# Patient Record
Sex: Female | Born: 1965 | Race: White | Hispanic: No | Marital: Single | Smoking: Former smoker
Health system: Southern US, Community
[De-identification: ages and names within clinical notes are randomized; demographics above are authoritative.]

## PROBLEM LIST (undated history)

## (undated) DIAGNOSIS — K635 Polyp of colon: Secondary | ICD-10-CM

## (undated) DIAGNOSIS — K8020 Calculus of gallbladder without cholecystitis without obstruction: Secondary | ICD-10-CM

## (undated) DIAGNOSIS — C801 Malignant (primary) neoplasm, unspecified: Secondary | ICD-10-CM

## (undated) DIAGNOSIS — K5790 Diverticulosis of intestine, part unspecified, without perforation or abscess without bleeding: Secondary | ICD-10-CM

## (undated) DIAGNOSIS — T7840XA Allergy, unspecified, initial encounter: Secondary | ICD-10-CM

## (undated) DIAGNOSIS — K648 Other hemorrhoids: Secondary | ICD-10-CM

## (undated) DIAGNOSIS — D126 Benign neoplasm of colon, unspecified: Secondary | ICD-10-CM

## (undated) DIAGNOSIS — K2970 Gastritis, unspecified, without bleeding: Secondary | ICD-10-CM

## (undated) DIAGNOSIS — D649 Anemia, unspecified: Secondary | ICD-10-CM

## (undated) DIAGNOSIS — D49519 Neoplasm of unspecified behavior of unspecified kidney: Secondary | ICD-10-CM

## (undated) DIAGNOSIS — K760 Fatty (change of) liver, not elsewhere classified: Secondary | ICD-10-CM

## (undated) HISTORY — DX: Anemia, unspecified: D64.9

## (undated) HISTORY — DX: Other hemorrhoids: K64.8

## (undated) HISTORY — DX: Polyp of colon: K63.5

## (undated) HISTORY — DX: Benign neoplasm of colon, unspecified: D12.6

## (undated) HISTORY — PX: PARTIAL HYSTERECTOMY: SHX80

## (undated) HISTORY — PX: CHOLECYSTECTOMY: SHX55

## (undated) HISTORY — PX: TONSILLECTOMY: SUR1361

## (undated) HISTORY — DX: Diverticulosis of intestine, part unspecified, without perforation or abscess without bleeding: K57.90

## (undated) HISTORY — DX: Gastritis, unspecified, without bleeding: K29.70

## (undated) HISTORY — PX: ABDOMINAL HYSTERECTOMY: SHX81

## (undated) HISTORY — DX: Fatty (change of) liver, not elsewhere classified: K76.0

## (undated) HISTORY — DX: Neoplasm of unspecified behavior of unspecified kidney: D49.519

## (undated) HISTORY — DX: Allergy, unspecified, initial encounter: T78.40XA

## (undated) HISTORY — DX: Calculus of gallbladder without cholecystitis without obstruction: K80.20

---
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
## 1988-01-30 HISTORY — PX: FOOT SURGERY: SHX648

## 2013-12-04 ENCOUNTER — Encounter: Payer: Self-pay | Admitting: Podiatry

## 2013-12-04 ENCOUNTER — Ambulatory Visit (INDEPENDENT_AMBULATORY_CARE_PROVIDER_SITE_OTHER): Payer: 59 | Admitting: Podiatry

## 2013-12-04 ENCOUNTER — Ambulatory Visit (INDEPENDENT_AMBULATORY_CARE_PROVIDER_SITE_OTHER): Payer: 59

## 2013-12-04 VITALS — BP 133/82 | HR 64 | Resp 13

## 2013-12-04 DIAGNOSIS — M722 Plantar fascial fibromatosis: Secondary | ICD-10-CM

## 2013-12-04 MED ORDER — TRIAMCINOLONE ACETONIDE 10 MG/ML IJ SUSP
10.0000 mg | Freq: Once | INTRAMUSCULAR | Status: AC
  Administered 2013-12-04: 10 mg

## 2013-12-04 NOTE — Patient Instructions (Signed)
Plantar Fasciitis (Heel Spur Syndrome) with Rehab The plantar fascia is a fibrous, ligament-like, soft-tissue structure that spans the bottom of the foot. Plantar fasciitis is a condition that causes pain in the foot due to inflammation of the tissue. SYMPTOMS   Pain and tenderness on the underneath side of the foot.  Pain that worsens with standing or walking. CAUSES  Plantar fasciitis is caused by irritation and injury to the plantar fascia on the underneath side of the foot. Common mechanisms of injury include:  Direct trauma to bottom of the foot.  Damage to a small nerve that runs under the foot where the main fascia attaches to the heel bone.  Stress placed on the plantar fascia due to bone spurs. RISK INCREASES WITH:   Activities that place stress on the plantar fascia (running, jumping, pivoting, or cutting).  Poor strength and flexibility.  Improperly fitted shoes.  Tight calf muscles.  Flat feet.  Failure to warm-up properly before activity.  Obesity. PREVENTION  Warm up and stretch properly before activity.  Allow for adequate recovery between workouts.  Maintain physical fitness:  Strength, flexibility, and endurance.  Cardiovascular fitness.  Maintain a health body weight.  Avoid stress on the plantar fascia.  Wear properly fitted shoes, including arch supports for individuals who have flat feet. PROGNOSIS  If treated properly, then the symptoms of plantar fasciitis usually resolve without surgery. However, occasionally surgery is necessary. RELATED COMPLICATIONS   Recurrent symptoms that may result in a chronic condition.  Problems of the lower back that are caused by compensating for the injury, such as limping.  Pain or weakness of the foot during push-off following surgery.  Chronic inflammation, scarring, and partial or complete fascia tear, occurring more often from repeated injections. TREATMENT  Treatment initially involves the use of  ice and medication to help reduce pain and inflammation. The use of strengthening and stretching exercises may help reduce pain with activity, especially stretches of the Achilles tendon. These exercises may be performed at home or with a therapist. Your caregiver may recommend that you use heel cups of arch supports to help reduce stress on the plantar fascia. Occasionally, corticosteroid injections are given to reduce inflammation. If symptoms persist for greater than 6 months despite non-surgical (conservative), then surgery may be recommended.  MEDICATION   If pain medication is necessary, then nonsteroidal anti-inflammatory medications, such as aspirin and ibuprofen, or other minor pain relievers, such as acetaminophen, are often recommended.  Do not take pain medication within 7 days before surgery.  Prescription pain relievers may be given if deemed necessary by your caregiver. Use only as directed and only as much as you need.  Corticosteroid injections may be given by your caregiver. These injections should be reserved for the most serious cases, because they may only be given a certain number of times. HEAT AND COLD  Cold treatment (icing) relieves pain and reduces inflammation. Cold treatment should be applied for 10 to 15 minutes every 2 to 3 hours for inflammation and pain and immediately after any activity that aggravates your symptoms. Use ice packs or massage the area with a piece of ice (ice massage).  Heat treatment may be used prior to performing the stretching and strengthening activities prescribed by your caregiver, physical therapist, or athletic trainer. Use a heat pack or soak the injury in warm water. SEEK IMMEDIATE MEDICAL CARE IF:  Treatment seems to offer no benefit, or the condition worsens.  Any medications produce adverse side effects. EXERCISES RANGE   OF MOTION (ROM) AND STRETCHING EXERCISES - Plantar Fasciitis (Heel Spur Syndrome) These exercises may help you  when beginning to rehabilitate your injury. Your symptoms may resolve with or without further involvement from your physician, physical therapist or athletic trainer. While completing these exercises, remember:   Restoring tissue flexibility helps normal motion to return to the joints. This allows healthier, less painful movement and activity.  An effective stretch should be held for at least 30 seconds.  A stretch should never be painful. You should only feel a gentle lengthening or release in the stretched tissue. RANGE OF MOTION - Toe Extension, Flexion  Sit with your right / left leg crossed over your opposite knee.  Grasp your toes and gently pull them back toward the top of your foot. You should feel a stretch on the bottom of your toes and/or foot.  Hold this stretch for __________ seconds.  Now, gently pull your toes toward the bottom of your foot. You should feel a stretch on the top of your toes and or foot.  Hold this stretch for __________ seconds. Repeat __________ times. Complete this stretch __________ times per day.  RANGE OF MOTION - Ankle Dorsiflexion, Active Assisted  Remove shoes and sit on a chair that is preferably not on a carpeted surface.  Place right / left foot under knee. Extend your opposite leg for support.  Keeping your heel down, slide your right / left foot back toward the chair until you feel a stretch at your ankle or calf. If you do not feel a stretch, slide your bottom forward to the edge of the chair, while still keeping your heel down.  Hold this stretch for __________ seconds. Repeat __________ times. Complete this stretch __________ times per day.  STRETCH - Gastroc, Standing  Place hands on wall.  Extend right / left leg, keeping the front knee somewhat bent.  Slightly point your toes inward on your back foot.  Keeping your right / left heel on the floor and your knee straight, shift your weight toward the wall, not allowing your back to  arch.  You should feel a gentle stretch in the right / left calf. Hold this position for __________ seconds. Repeat __________ times. Complete this stretch __________ times per day. STRETCH - Soleus, Standing  Place hands on wall.  Extend right / left leg, keeping the other knee somewhat bent.  Slightly point your toes inward on your back foot.  Keep your right / left heel on the floor, bend your back knee, and slightly shift your weight over the back leg so that you feel a gentle stretch deep in your back calf.  Hold this position for __________ seconds. Repeat __________ times. Complete this stretch __________ times per day. STRETCH - Gastrocsoleus, Standing  Note: This exercise can place a lot of stress on your foot and ankle. Please complete this exercise only if specifically instructed by your caregiver.   Place the ball of your right / left foot on a step, keeping your other foot firmly on the same step.  Hold on to the wall or a rail for balance.  Slowly lift your other foot, allowing your body weight to press your heel down over the edge of the step.  You should feel a stretch in your right / left calf.  Hold this position for __________ seconds.  Repeat this exercise with a slight bend in your right / left knee. Repeat __________ times. Complete this stretch __________ times per day.    STRENGTHENING EXERCISES - Plantar Fasciitis (Heel Spur Syndrome)  These exercises may help you when beginning to rehabilitate your injury. They may resolve your symptoms with or without further involvement from your physician, physical therapist or athletic trainer. While completing these exercises, remember:   Muscles can gain both the endurance and the strength needed for everyday activities through controlled exercises.  Complete these exercises as instructed by your physician, physical therapist or athletic trainer. Progress the resistance and repetitions only as guided. STRENGTH -  Towel Curls  Sit in a chair positioned on a non-carpeted surface.  Place your foot on a towel, keeping your heel on the floor.  Pull the towel toward your heel by only curling your toes. Keep your heel on the floor.  If instructed by your physician, physical therapist or athletic trainer, add ____________________ at the end of the towel. Repeat __________ times. Complete this exercise __________ times per day. STRENGTH - Ankle Inversion  Secure one end of a rubber exercise band/tubing to a fixed object (table, pole). Loop the other end around your foot just before your toes.  Place your fists between your knees. This will focus your strengthening at your ankle.  Slowly, pull your big toe up and in, making sure the band/tubing is positioned to resist the entire motion.  Hold this position for __________ seconds.  Have your muscles resist the band/tubing as it slowly pulls your foot back to the starting position. Repeat __________ times. Complete this exercises __________ times per day.  Document Released: 01/15/2005 Document Revised: 04/09/2011 Document Reviewed: 04/29/2008 [employer] Patient Information 2015 [employer]. This information is not intended to replace advice given to you by your health care provider. Make sure you discuss any questions you have with your health care provider.  

## 2013-12-04 NOTE — Progress Notes (Signed)
   Subjective:    Patient ID: Tina Adams, female    DOB: [birth year], 48 y.o.   MRN: [id number]  HPI Tina Adams, 48 year old female, returns the office today for complaints of right heel pain for which she believes his plantar fasciitis. She is previously been treated for plantar fasciitis and left foot. She has tried stretching exercises as well as icing and shoe gear modifications without much resolution in symptoms. She denies any recent injury or trauma to the area. She also states that she has bilateral lower extremity numbness starting from approximately mid leg to her feet. She is previously seen a neurologist for this and they have been unable to identify the etiology. She denies any acute changes in the symptoms. No other complaints at this time.   Review of Systems  HENT: Positive for congestion, ear pain and sinus pressure.   All other systems reviewed and are negative.      Objective:   Physical Exam AAO 3, NAD DP/PT pulses palpable bilaterally, CRT less than 3 seconds Protective sensation decreased with Simms Weinstein monofilament, decreased vibratory sensation. Tenderness palpation over the plantar medial tubercle of the calcaneus on the right foot at the insertion of the plantar fascia. There is no pain along the course of the plantar fascia within the arch of the foot. There is no pain with lateral compression of the calcaneus or with vibratory sensation. No pain on the posterior aspect or along the course of the Achilles tendon. There is no edema, erythema, increased warmth over the area. Overall cavus foot type with hammertoe contractures. MMT 5/5, ROM WNL No open lesions No calf pain, swelling, warmth, erythema       Assessment & Plan:  48 year old female with cavus foot type and right heel pain, likely plantar fasciitis; neuropathy. -X-rays were obtained and reviewed with the patient. -Conservative versus surgical treatment discussed including alternatives,  risks, complications. -Patient elects to proceed with steroid injection into the right heel. Under sterile skin preparation, a total of 2.5cc of kenalog 10, 0.5% Marcaine plain, and 2% lidocaine plain were infiltrated into the symptomatic area without complication. A band-aid was applied. Patient tolerated the injection well without complication.  -Plantar fascial brace dispensed. -Discussed stretching exercises -Ice to the affected area. -Discussed proper shoe gear and possible inserts or orthotics to help support her foot type. -Follow-up in 3 weeks or sooner if any problems are to arise. In the meantime call the office with any questions, concerns, changes symptoms. Patient states that she previously saw neurology for the neuropathy and she was given B-12 injections but. Going as they were not helping. I discussed with her that if she would like further workup for this to be reevaluated by neurology.

## 2013-12-09 DIAGNOSIS — M722 Plantar fascial fibromatosis: Secondary | ICD-10-CM | POA: Insufficient documentation

## 2015-01-30 HISTORY — PX: BLADDER REPAIR: SHX76

## 2015-04-13 ENCOUNTER — Encounter: Payer: Self-pay | Admitting: Podiatry

## 2015-04-13 ENCOUNTER — Ambulatory Visit (INDEPENDENT_AMBULATORY_CARE_PROVIDER_SITE_OTHER): Payer: 59 | Admitting: Podiatry

## 2015-04-13 ENCOUNTER — Ambulatory Visit (HOSPITAL_BASED_OUTPATIENT_CLINIC_OR_DEPARTMENT_OTHER)
Admission: RE | Admit: 2015-04-13 | Discharge: 2015-04-13 | Disposition: A | Discharge status: Home / Self Care | Payer: 59 | Source: Ambulatory Visit | Attending: Podiatry | Admitting: Podiatry

## 2015-04-13 VITALS — BP 174/98 | HR 72 | Resp 18

## 2015-04-13 DIAGNOSIS — M79671 Pain in right foot: Secondary | ICD-10-CM | POA: Insufficient documentation

## 2015-04-13 DIAGNOSIS — R52 Pain, unspecified: Secondary | ICD-10-CM

## 2015-04-13 DIAGNOSIS — L84 Corns and callosities: Secondary | ICD-10-CM

## 2015-04-13 DIAGNOSIS — G5761 Lesion of plantar nerve, right lower limb: Secondary | ICD-10-CM | POA: Diagnosis not present

## 2015-04-13 DIAGNOSIS — D3610 Benign neoplasm of peripheral nerves and autonomic nervous system, unspecified: Secondary | ICD-10-CM

## 2015-04-14 NOTE — Progress Notes (Signed)
Patient ID: Tina Adams, female   DOB: 18-Nov-1965, 50 y.o.   MRN: [id number]  Subjective: 50 year old female presents the office today for concerns of right foot pain which is been ongoing the last couple months that seems worsening of the last week. She states that she gets a sharp pain between the third and fourth toes which radius of the toes and at the foot at times. She has a states that she's got a callus on the bottom of her foot which is very painful. No recent injury or trauma. No swelling or redness. She states that the bandage is noted for the neuropathy has decreased since her last visit with me as she is change her diet and seems to help. Denies any systemic complaints such as fevers, chills, nausea, vomiting. No acute changes since last appointment, and no other complaints at this time.   Objective: AAO x3, NAD DP/PT pulses palpable bilaterally, CRT less than 3 seconds Protective sensation decreased with Simms Weinstein monofilament There is tenderness palpation to the right third interspace. There is no palpable neuroma identified at this time there is no clicking sensation. There is also hyperkeratotic lesion submetatarsal 4 with tenderness to palpation along this area. Upon debridement no underlying ulceration, drainage or other signs of infection. There is a cavus foot type present bilaterally. No areas of pinpoint bony tenderness or pain with vibratory sensation. MMT 5/5, ROM WNL. No edema, erythema, increase in warmth to bilateral lower extremities.  No open lesions or pre-ulcerative lesions.  No pain with calf compression, swelling, warmth, erythema  Assessment: Right third interspace pain neuroma versus bursa; hyperkeratotic lesion  Plan: -All treatment options discussed with the patient including all alternatives, risks, complications.  -X-rays were ordered and reviewed. -Hyperkeratotic lesion was debrided without complications or bleeding -Discussed steroid injection  into the interspace and she wishes to proceed with this. Under sterile conditions a mixture of Kenalog and local anesthetic was infiltrated into the area of maximal tenderness without complications. Post injection care was discussed. -Metatarsal off on an pads were dispensed. -Patient encouraged to call the office with any questions, concerns, change in symptoms.   Celesta Gentile, DPM

## 2015-05-11 ENCOUNTER — Ambulatory Visit: Payer: 59 | Admitting: Podiatry

## 2017-03-28 ENCOUNTER — Encounter: Payer: Self-pay | Admitting: Internal Medicine

## 2017-05-21 ENCOUNTER — Encounter: Payer: Self-pay | Admitting: *Deleted

## 2017-06-18 ENCOUNTER — Ambulatory Visit: Payer: 59 | Admitting: Internal Medicine

## 2017-06-18 ENCOUNTER — Encounter: Payer: Self-pay | Admitting: Internal Medicine

## 2017-06-18 VITALS — BP 122/86 | HR 68 | Ht 69.0 in | Wt 224.6 lb

## 2017-06-18 DIAGNOSIS — R1013 Epigastric pain: Secondary | ICD-10-CM | POA: Diagnosis not present

## 2017-06-18 DIAGNOSIS — Z86010 Personal history of colonic polyps: Secondary | ICD-10-CM

## 2017-06-18 MED ORDER — RANITIDINE HCL 150 MG PO TABS: 150.0000 mg | ORAL_TABLET | Freq: Every day | ORAL | 0 refills | Status: DC | PRN

## 2017-06-18 MED ORDER — SUPREP BOWEL PREP KIT 17.5-3.13-1.6 GM/177ML PO SOLN: 1.0000 | ORAL | 0 refills | Status: DC

## 2017-06-18 NOTE — Patient Instructions (Signed)
You have been scheduled for a colonoscopy. Please follow written instructions given to you at your visit today.  Please pick up your prep supplies at the pharmacy within the next 1-3 days. If you use inhalers (even only as needed), please bring them with you on the day of your procedure. Your physician has requested that you go to www.[email or web address].[email or web address] and enter the access code given to you at your visit today. This web site gives a general overview about your procedure. However, you should still follow specific instructions given to you by our office regarding your preparation for the procedure.  Please purchase the following medications over the counter and take as directed: Zantac 150 mg once every evening as needed.  If you are age 45 or older, your body mass index should be between 23-30. Your Body mass index is 33.17 kg/m. If this is out of the aforementioned range listed, please consider follow up with your Primary Care Provider.  If you are age 66 or younger, your body mass index should be between 19-25. Your Body mass index is 33.17 kg/m. If this is out of the aformentioned range listed, please consider follow up with your Primary Care Provider.

## 2017-06-19 NOTE — Progress Notes (Signed)
Patient ID: Tina Adams, female   DOB: 1965/12/12, 52 y.o.   MRN: [id number] HPI: Tina Adams is a 52 year old female with a past medical history of colon polyps, gallstones status post cholecystectomy who was seen in consultation at the request of Dr. Arnette Norris to consider surveillance colonoscopy.  She is here alone today.  She reports that she has been feeling well.  Occasionally she does notice belching and upper abdominal bloating with certain foods and also with eating quickly.  Foods such as iceberg lettuce as well as red meats lead to the symptoms.  Symptoms are fairly rapidly relieved by Tums or Rolaids.  Happens a few times per week.  No dysphagia or odynophagia.  No true heartburn.  Worse after the evening meal.  Was on remote PPI in the past for heartburn and dyspeptic symptoms that have now mostly been absent.  She felt she needed these when she was under more stress and overall feels to be in a better place currently.  She has had colonoscopies in the past in 2013 her last colonoscopy was performed.  She had an adenomatous polyp and a sessile serrated polyp removed.  In 2008 she had a hyperplastic polyp removed.  Her mother has a history of colon polyps as well.  No recent change in bowel habits, no report of troublesome constipation or diarrhea.  No blood in her stool or melena.  Past Medical History:  Diagnosis Date  . Anemia   . Diverticulosis   . Gallstones   . Gastritis   . Hyperplastic colon polyp   . Tubular adenoma of colon     Past Surgical History:  Procedure Laterality Date  . CHOLECYSTECTOMY    . FOOT SURGERY  1990   plantar fasciitis  . PARTIAL HYSTERECTOMY    . TONSILLECTOMY      Outpatient Medications Prior to Visit  Medication Sig Dispense Refill  . ALPRAZolam (XANAX) 0.5 MG tablet Take 0.5 mg by mouth at bedtime as needed.     . fexofenadine-pseudoephedrine (ALLEGRA-D 24) 180-240 MG 24 hr tablet Take 1 tablet by mouth daily.    Marland Kitchen ALPRAZolam (XANAX PO)  Take by mouth at bedtime as needed.    . loratadine (CLARITIN) 10 MG tablet Take 10 mg by mouth.     No facility-administered medications prior to visit.     Allergies  Allergen Reactions  . Codeine Nausea And Vomiting  . Penicillins Hives  . Sulfa Antibiotics     Family History  Problem Relation Age of Onset  . COPD Mother   . Heart disease Mother   . Colon polyps Mother        in her 30s  . Diabetes Father   . Heart disease Father   . Hyperlipidemia Father   . Heart disease Maternal Grandmother   . Colon cancer Neg Hx   . Esophageal cancer Neg Hx   . Stomach cancer Neg Hx   . Pancreatic cancer Neg Hx     Social History   Tobacco Use  . Smoking status: Light Tobacco Smoker  . Smokeless tobacco: Never Used  Substance Use Topics  . Alcohol use: Yes    Alcohol/week: 2.4 oz    Types: 4 Glasses of wine per week  . Drug use: Never    ROS: As per history of present illness, otherwise negative  BP 122/86   Pulse 68   Ht 5\' 9"  (1.753 m)   Wt 224 lb 9.6 oz (101.9 kg)   BMI 33.17  kg/m  Constitutional: Well-developed and well-nourished. No distress. HEENT: Normocephalic and atraumatic. Conjunctivae are normal.  No scleral icterus. Neck: Neck supple. Trachea midline. Cardiovascular: Normal rate, regular rhythm and intact distal pulses. No M/R/G Pulmonary/chest: Effort normal and breath sounds normal. No wheezing, rales or rhonchi. Abdominal: Soft, nontender, nondistended. Bowel sounds active throughout. There are no masses palpable. No hepatosplenomegaly. Extremities: no clubbing, cyanosis, or edema Neurological: Alert and oriented to person place and time. Skin: Skin is warm and dry.  Psychiatric: Normal mood and affect. Behavior is normal.   ASSESSMENT/PLAN: 52 year old female with a past medical history of colon polyps, gallstones status post cholecystectomy who was seen in consultation at the request of Dr. Arnette Norris to consider surveillance colonoscopy  1.   History of adenomatous and sessile serrated colon polyp --she is due surveillance colonoscopy at this time.  I recommended we proceed with colonoscopy, she requests a Friday appointment.  We discussed the risk, benefits and alternatives and she is agreeable and wishes to proceed.  2.  Mild dyspepsia/belching --relieved by Rolaids and diet induced.  I recommended she consider using ranitidine 150 mg in the evening when the symptoms are troublesome.  If they worsen or change she is asked to notify me.      [id number], [address], [address] [place], [place] [id number]

## 2017-08-09 ENCOUNTER — Encounter: Payer: 59 | Admitting: Internal Medicine

## 2017-08-13 ENCOUNTER — Encounter: Payer: Self-pay | Admitting: Internal Medicine

## 2017-08-27 ENCOUNTER — Ambulatory Visit (AMBULATORY_SURGERY_CENTER): Payer: 59 | Admitting: Internal Medicine

## 2017-08-27 ENCOUNTER — Encounter: Payer: Self-pay | Admitting: Internal Medicine

## 2017-08-27 ENCOUNTER — Other Ambulatory Visit: Payer: Self-pay

## 2017-08-27 VITALS — BP 129/60 | HR 56 | Temp 97.3°F | Resp 11 | Ht 69.0 in | Wt 224.0 lb

## 2017-08-27 DIAGNOSIS — D125 Benign neoplasm of sigmoid colon: Secondary | ICD-10-CM

## 2017-08-27 DIAGNOSIS — D120 Benign neoplasm of cecum: Secondary | ICD-10-CM | POA: Diagnosis not present

## 2017-08-27 DIAGNOSIS — D122 Benign neoplasm of ascending colon: Secondary | ICD-10-CM | POA: Diagnosis not present

## 2017-08-27 DIAGNOSIS — K635 Polyp of colon: Secondary | ICD-10-CM

## 2017-08-27 DIAGNOSIS — D124 Benign neoplasm of descending colon: Secondary | ICD-10-CM | POA: Diagnosis not present

## 2017-08-27 DIAGNOSIS — Z86010 Personal history of colonic polyps: Secondary | ICD-10-CM | POA: Diagnosis not present

## 2017-08-27 MED ORDER — SODIUM CHLORIDE 0.9 % IV SOLN: 500.0000 mL | Freq: Once | INTRAVENOUS | Status: DC

## 2017-08-27 NOTE — Progress Notes (Signed)
Pt's states no medical or surgical changes since previsit or office visit. 

## 2017-08-27 NOTE — Progress Notes (Signed)
No problems noted in the recovery room. maw 

## 2017-08-27 NOTE — Op Note (Signed)
[facility] Patient Name: Tina Adams Procedure Date: 08/27/2017 8:15 AM MRN: [id number] Endoscopist: Jerene Bears , MD Age: 52 Referring MD:  Date of Birth: Jul 19, 1965 Gender: Female Account #: [id number] Procedure:                Colonoscopy Indications:              Surveillance: Personal history of adenomatous                            polyps on last colonoscopy > 5 years ago, Last                            colonoscopy: 2013 Medicines:                Monitored Anesthesia Care Procedure:                Pre-Anesthesia Assessment:                           - Prior to the procedure, a History and Physical                            was performed, and patient medications and                            allergies were reviewed. The patient's tolerance of                            previous anesthesia was also reviewed. The risks                            and benefits of the procedure and the sedation                            options and risks were discussed with the patient.                            All questions were answered, and informed consent                            was obtained. Prior Anticoagulants: The patient has                            taken no previous anticoagulant or antiplatelet                            agents. ASA Grade Assessment: II - A patient with                            mild systemic disease. After reviewing the risks                            and benefits, the patient was deemed in  satisfactory condition to undergo the procedure.                           After obtaining informed consent, the colonoscope                            was passed under direct vision. Throughout the                            procedure, the patient's blood pressure, pulse, and                            oxygen saturations were monitored continuously. The                            Colonoscope was introduced through the anus and                             advanced to the cecum, identified by appendiceal                            orifice and ileocecal valve. The colonoscopy was                            performed without difficulty. The patient tolerated                            the procedure well. The quality of the bowel                            preparation was good. The ileocecal valve,                            appendiceal orifice, and rectum were photographed. Scope In: 8:32:22 AM Scope Out: 6:94:85 AM Scope Withdrawal Time: 0 hours 30 minutes 30 seconds  Total Procedure Duration: 0 hours 33 minutes 56 seconds  Findings:                 The digital rectal exam was normal.                           Four sessile polyps were found in the cecum. The                            polyps were 4 to 7 mm in size. These polyps were                            removed with a cold snare. Resection and retrieval                            were complete.                           Four sessile polyps were found in the ascending  colon. The polyps were 3 to 8 mm in size. These                            polyps were removed with a cold snare. Resection                            and retrieval were complete.                           Three sessile polyps were found in the descending                            colon. The polyps were 3 to 5 mm in size. These                            polyps were removed with a cold snare. Resection                            and retrieval were complete.                           A 15 mm polyp was found in the distal sigmoid                            colon. The polyp was pedunculated. The polyp was                            removed with a hot snare. Resection and retrieval                            were complete.                           Multiple small and large-mouthed diverticula were                            found in the sigmoid colon, descending colon and                             distal transverse colon.                           The retroflexed view of the distal rectum and anal                            verge was normal and showed no anal or rectal                            abnormalities. Complications:            No immediate complications. Estimated Blood Loss:     Estimated blood loss was minimal. Impression:               - Four 4 to 7 mm polyps in the cecum, removed  with                            a cold snare. Resected and retrieved.                           - Four 3 to 8 mm polyps in the ascending colon,                            removed with a cold snare. Resected and retrieved.                           - Three 3 to 5 mm polyps in the descending colon,                            removed with a cold snare. Resected and retrieved.                           - One 15 mm polyp in the distal sigmoid colon,                            removed with a hot snare. Resected and retrieved.                           - Diverticulosis in the sigmoid colon, in the                            descending colon and in the distal transverse colon.                           - The distal rectum and anal verge are normal on                            retroflexion view. Recommendation:           - Patient has a contact number available for                            emergencies. The signs and symptoms of potential                            delayed complications were discussed with the                            patient. Return to normal activities tomorrow.                            Written discharge instructions were provided to the                            patient.                           - Resume previous diet.                           -  Continue present medications.                           - Await pathology results.                           - Repeat colonoscopy is recommended for                            surveillance of multiple  polyps. The colonoscopy                            date will be determined after pathology results                            from today's exam become available for review.                           - No ibuprofen, naproxen, or other non-steroidal                            anti-inflammatory drugs for 2 weeks after polyp                            removal. Jerene Bears, MD 08/27/2017 9:12:21 AM This report has been signed electronically.

## 2017-08-27 NOTE — Patient Instructions (Signed)
YOU HAD AN ENDOSCOPIC PROCEDURE TODAY AT [facility]:   Refer to the procedure report that was given to you for any specific questions about what was found during the examination.  If the procedure report does not answer your questions, please call your gastroenterologist to clarify.  If you requested that your care partner not be given the details of your procedure findings, then the procedure report has been included in a sealed envelope for you to review at your convenience later.  YOU SHOULD EXPECT: Some feelings of bloating in the abdomen. Passage of more gas than usual.  Walking can help get rid of the air that was put into your GI tract during the procedure and reduce the bloating. If you had a lower endoscopy (such as a colonoscopy or flexible sigmoidoscopy) you may notice spotting of blood in your stool or on the toilet paper. If you underwent a bowel prep for your procedure, you may not have a normal bowel movement for a few days.  Please Note:  You might notice some irritation and congestion in your nose or some drainage.  This is from the oxygen used during your procedure.  There is no need for concern and it should clear up in a day or so.  SYMPTOMS TO REPORT IMMEDIATELY:   Following lower endoscopy (colonoscopy or flexible sigmoidoscopy):  Excessive amounts of blood in the stool  Significant tenderness or worsening of abdominal pains  Swelling of the abdomen that is new, acute  Fever of 100F or higher  For urgent or emergent issues, a gastroenterologist can be reached at any hour by calling [phone].   DIET:  We do recommend a small meal at first, but then you may proceed to your regular diet.  Drink plenty of fluids but you should avoid alcoholic beverages for 24 hours.  ACTIVITY:  You should plan to take it easy for the rest of today and you should NOT DRIVE or use heavy machinery until tomorrow (because of the sedation medicines used during the test).     FOLLOW UP: Our staff will call the number listed on your records the next business day following your procedure to check on you and address any questions or concerns that you may have regarding the information given to you following your procedure. If we do not reach you, we will leave a message.  However, if you are feeling well and you are not experiencing any problems, there is no need to return our call.  We will assume that you have returned to your regular daily activities without incident.  If any biopsies were taken you will be contacted by phone or by letter within the next 1-3 weeks.  Please call us at [phone] if you have not heard about the biopsies in 3 weeks.    SIGNATURES/CONFIDENTIALITY: You and/or your care partner have signed paperwork which will be entered into your electronic medical record.  These signatures attest to the fact that that the information above on your After Visit Summary has been reviewed and is understood.  Full responsibility of the confidentiality of this discharge information lies with you and/or your care-partner.   Handouts were given to your care partner on polyps and diverticulosis. You may resume your other  current medications today. NO ASPIRIN, ASPIRIN CONTAINING PRODUCTS (BC OR GOODY POWDERS) OR NSAIDS (IBUPROFEN, ADVIL, ALEVE, AND MOTRIN) FOR 2 weeks; TYLENOL IS OK TO TAKE Await biopsy results. Please call if any questions or concerns.

## 2017-08-27 NOTE — Progress Notes (Signed)
To PACU, VSS. Report to [email or web address] 

## 2017-08-27 NOTE — Progress Notes (Signed)
Called to room to assist during endoscopic procedure.  Patient ID and intended procedure confirmed with present staff. Received instructions for my participation in the procedure from the performing physician.  

## 2017-08-28 ENCOUNTER — Telehealth: Payer: Self-pay | Admitting: *Deleted

## 2017-08-28 NOTE — Telephone Encounter (Signed)
Attempted to call the patient back and left voicemail instructing patient to try acetaminophen per bottle instruction and to call us if she's having fever, bleeding or unable to eat and if the lower back pain persists that we need to obtain an xray. Left call back number. Sm

## 2017-08-28 NOTE — Telephone Encounter (Signed)
Pt with colon yesterday, multiple polypectomies.  One hot in lower sigmoid Please re-check on her this afternoon Fever? Able to eat? Bleeding? Can try APAP per bottle instruction. Avoid NSAIDs If persistent discomfort then needs 2 v ab xray.

## 2017-08-28 NOTE — Telephone Encounter (Signed)
  Follow up Call-  Call back number 08/27/2017  Post procedure Call Back phone  # [phone]  Permission to leave phone message Yes  Some recent data might be hidden     Patient questions:  Do you have a fever, pain , or abdominal swelling? Yes.   Pain Score  6 * lower back and tenderness in her abdomen  Have you tolerated food without any problems? Yes.    Have you been able to return to your normal activities? No.  Do you have any questions about your discharge instructions: Diet   No. Medications  No. Follow up visit  No.  Do you have questions or concerns about your Care? Yes.  Patient has been having lower back pain and tenderness in her abdomen since her procedure yesterday. No fever and no bleeding. She says shes "never had this back pain before or with her prior colonoscopies."  Actions: * If pain score is 4 or above: Physician/ provider Notified : Zenovia Jarred, MD.

## 2017-08-29 ENCOUNTER — Encounter: Payer: Self-pay | Admitting: Internal Medicine

## 2017-09-06 ENCOUNTER — Telehealth: Payer: Self-pay | Admitting: Internal Medicine

## 2017-09-06 NOTE — Telephone Encounter (Signed)
Results letter reviewed with pt and she is aware.

## 2018-02-11 NOTE — Telephone Encounter (Signed)
Patient to make a follow up appointment in the office per Dr. Payton Emerald patient verbalized understanding. Sm

## 2018-02-18 ENCOUNTER — Encounter: Payer: Self-pay | Admitting: Internal Medicine

## 2018-02-18 ENCOUNTER — Ambulatory Visit: Payer: 59 | Admitting: Internal Medicine

## 2018-02-18 ENCOUNTER — Other Ambulatory Visit (INDEPENDENT_AMBULATORY_CARE_PROVIDER_SITE_OTHER): Payer: 59

## 2018-02-18 VITALS — BP 146/82 | HR 68 | Ht 68.0 in | Wt 228.0 lb

## 2018-02-18 DIAGNOSIS — K5732 Diverticulitis of large intestine without perforation or abscess without bleeding: Secondary | ICD-10-CM

## 2018-02-18 DIAGNOSIS — R194 Change in bowel habit: Secondary | ICD-10-CM | POA: Diagnosis not present

## 2018-02-18 DIAGNOSIS — R110 Nausea: Secondary | ICD-10-CM

## 2018-02-18 DIAGNOSIS — R1030 Lower abdominal pain, unspecified: Secondary | ICD-10-CM

## 2018-02-18 DIAGNOSIS — R109 Unspecified abdominal pain: Secondary | ICD-10-CM | POA: Diagnosis not present

## 2018-02-18 DIAGNOSIS — R198 Other specified symptoms and signs involving the digestive system and abdomen: Secondary | ICD-10-CM

## 2018-02-18 DIAGNOSIS — Z86010 Personal history of colonic polyps: Secondary | ICD-10-CM

## 2018-02-18 LAB — COMPREHENSIVE METABOLIC PANEL
ALT: 20 U/L (ref 0–35)
AST: 16 U/L (ref 0–37)
Albumin: 4.1 g/dL (ref 3.5–5.2)
Alkaline Phosphatase: 53 U/L (ref 39–117)
BUN: 11 mg/dL (ref 6–23)
CO2: 28 mEq/L (ref 19–32)
Calcium: 9.2 mg/dL (ref 8.4–10.5)
Chloride: 104 mEq/L (ref 96–112)
Creatinine, Ser: 0.72 mg/dL (ref 0.40–1.20)
GFR: 84.3 mL/min (ref >60.00)
Glucose, Bld: 92 mg/dL (ref 70–99)
Potassium: 4.2 mEq/L (ref 3.5–5.1)
Sodium: 139 mEq/L (ref 135–145)
Total Bilirubin: 0.3 mg/dL (ref 0.2–1.2)
Total Protein: 7 g/dL (ref 6.0–8.3)

## 2018-02-18 LAB — CBC WITH DIFFERENTIAL/PLATELET
Basophils Absolute: 0 10*3/uL (ref 0.0–0.1)
Basophils Relative: 0.6 % (ref 0.0–3.0)
Eosinophils Absolute: 0.2 10*3/uL (ref 0.0–0.7)
Eosinophils Relative: 2.9 % (ref 0.0–5.0)
HCT: 46.9 % — ABNORMAL HIGH (ref 36.0–46.0)
Hemoglobin: 15.6 g/dL — ABNORMAL HIGH (ref 12.0–15.0)
Lymphocytes Relative: 30.1 % (ref 12.0–46.0)
Lymphs Abs: 2.5 10*3/uL (ref 0.7–4.0)
MCHC: 33.3 g/dL (ref 30.0–36.0)
MCV: 86.4 fl (ref 78.0–100.0)
Monocytes Absolute: 0.6 10*3/uL (ref 0.1–1.0)
Monocytes Relative: 6.9 % (ref 3.0–12.0)
Neutro Abs: 5 10*3/uL (ref 1.4–7.7)
Neutrophils Relative %: 59.5 % (ref 43.0–77.0)
Platelets: 307 10*3/uL (ref 150.0–400.0)
RBC: 5.43 Mil/uL — ABNORMAL HIGH (ref 3.87–5.11)
RDW: 13.1 % (ref 11.5–15.5)
WBC: 8.4 10*3/uL (ref 4.0–10.5)

## 2018-02-18 MED ORDER — DICYCLOMINE HCL 10 MG PO CAPS: 10.0000 mg | ORAL_CAPSULE | Freq: Four times a day (QID) | ORAL | 2 refills | Status: DC | PRN

## 2018-02-18 NOTE — Progress Notes (Signed)
Subjective:    Patient ID: Tina Adams, female    DOB: 1965-04-21, 53 y.o.   MRN: [id number]  HPI Tina Adams is a 53 yo female with PMH of multiple adenomatous colon polyps, history of abnormal Pap smear, prior cholecystectomy, hysterectomy urethral sling who is here for follow-up to discuss change in bowel habits and left lower quadrant abdominal pain.  She is here alone today.  She is known to me from consultation in 2019 for colonoscopy.  Colonoscopy was performed on 08/27/2017.  This revealed 12 polyps ranging in size from 3 to 15 mm.  There was diverticulosis in the distal transverse, descending colon and sigmoid colon.  These polyps were found to be tubular adenoma predominantly and sessile serrated polyp.  Recall was recommended in 1 year due to the number of polyps removed.  She reports that over the last 3 months she has developed a change in her bowel habit.  She is developed lower abdominal bloating sensation, borborygmi and intermittent lower abdominal pain and discomfort.  This pain increased to become a true left lower quadrant pain reminiscent of prior diverticulitis.  She was seen by urgent care and given ciprofloxacin and metronidazole which she completed yesterday.  She took a 10-day course.  The left lower quadrant abdominal pain has definitively improved.  She still feels that things are "not right".  She has alternating bowel habits with constipation going as many as 2 to 3 days without bowel movement.  Stools have been so hard she has had to "push them out".  Then they can become loose and liquid-like looking like sediment in the bottom of the toilet bowl.  Previously bowel habits were more regular occurring daily.  She seen some scant blood with wiping but no blood in the stool.  No melena.  No upper GI complaints other than nausea without vomiting and mild occasional heartburn.  No dysphagia or odynophagia.  She is not taking any regular medicine.   Review of Systems    As per HPI, otherwise negative  Current Medications, Allergies, Past Medical History, Past Surgical History, Family History and Social History were reviewed in [employer] record.     Objective:   Physical Exam BP (!) 146/82 (BP Location: Left Arm, Patient Position: Sitting, Cuff Size: Normal)   Pulse 68   Ht 5\' 8"  (1.727 m) Comment: height measured without shoes  Wt 228 lb (103.4 kg)   BMI 34.67 kg/m  Constitutional: Well-developed and well-nourished. No distress. HEENT: Normocephalic and atraumatic.  Conjunctivae are normal.  No scleral icterus. Neck: Neck supple. Trachea midline. Cardiovascular: Normal rate, regular rhythm and intact distal pulses. No M/R/G Pulmonary/chest: Effort normal and breath sounds normal. No wheezing, rales or rhonchi. Abdominal: Soft, bilateral lower tenderness without rebound or guarding, nondistended. Bowel sounds active throughout. There are no masses palpable. No hepatosplenomegaly. Extremities: no clubbing, cyanosis, or edema Neurological: Alert and oriented to person place and time. Skin: Skin is warm and dry. Psychiatric: Normal mood and affect. Behavior is normal.      Assessment & Plan:  53 yo female with PMH of multiple adenomatous colon polyps, history of abnormal Pap smear, prior cholecystectomy, hysterectomy urethral sling who is here for follow-up to discuss change in bowel habits and left lower quadrant abdominal pain.  1.  Lower abdominal pain/recent empiric treatment for diverticulitis/change in bowel habits/nausea/alternating diarrhea and constipation --it is possible that she has had a low-level smoldering diverticulitis or diverticular inflammation that then became more significant recently  when she was treated with antibiotics.  Other possibilities include IBS, SIBO, segmental colitis associated with diverticulosis.  I recommended CBC, CMP today followed by CT scan of the abdomen pelvis with contrast.  Will  prescribe Bentyl 10 mg, 1 to 2 tablets every 6 hours as needed for lower abdominal crampy discomfort.  If all the above is unremarkable consider repeating colonoscopy sooner than the previous recommendation of July 2020.  2.  History of multiple adenomatous colon polyps --surveillance colonoscopy currently recommended July 2020 unless sooner based on #1  30 minutes spent with the patient today. Greater than 50% was spent in counseling and coordination of care with the patient

## 2018-02-18 NOTE — Patient Instructions (Addendum)
If you are age 55 or older, your body mass index should be between 23-30. Your Body mass index is 34.67 kg/m. If this is out of the aforementioned range listed, please consider follow up with your Primary Care Provider.  If you are age 64 or younger, your body mass index should be between 19-25. Your Body mass index is 34.67 kg/m. If this is out of the aformentioned range listed, please consider follow up with your Primary Care Provider.   You have been scheduled for a CT scan of the abdomen and pelvis at [facility] ([address]---this is in the same building as [facility]).   You are scheduled on 02/28/2018 at 9:00am. You should arrive 15 minutes prior to your appointment time for registration. Please follow the written instructions below on the day of your exam:  WARNING: IF YOU ARE ALLERGIC TO IODINE/X-RAY DYE, PLEASE NOTIFY RADIOLOGY IMMEDIATELY AT [phone]! YOU WILL BE GIVEN A 13 HOUR PREMEDICATION PREP.  1) Do not eat or drink anything after 5:00am (4 hours prior to your test) 2) You have been given 2 bottles of oral contrast to drink. The solution may taste better if refrigerated, but do NOT add ice or any other liquid to this solution. Shake well before drinking.    Drink 1 bottle of contrast @ 7:00am (2 hours prior to your exam)  Drink 1 bottle of contrast @ 8:00am (1 hour prior to your exam)  You may take any medications as prescribed with a small amount of water, if necessary. If you take any of the following medications: METFORMIN, GLUCOPHAGE, GLUCOVANCE, AVANDAMET, RIOMET, FORTAMET, ACTOPLUS MET, JANUMET, GLUMETZA or METAGLIP, you MAY be asked to HOLD this medication 48 hours AFTER the exam.  The purpose of you drinking the oral contrast is to aid in the visualization of your intestinal tract. The contrast solution may cause some diarrhea. Depending on your individual set of symptoms, you may also receive an intravenous injection of x-ray contrast/dye.  Plan on being at  HealthCare for 30 minutes or longer, depending on the type of exam you are having performed.  This test typically takes 30-45 minutes to complete.  If you have any questions regarding your exam or if you need to reschedule, you may call the CT department at [phone] between the hours of 8:00 am and 5:00 pm, Monday-Friday.  ________________________________________________________________________ Your provider has requested that you go to the basement level for lab work before leaving today. Press "B" on the elevator. The lab is located at the first door on the left as you exit the elevator.  We have sent the following medications to your pharmacy for you to pick up at your convenience: Dicyclomine 

## 2018-02-25 ENCOUNTER — Telehealth: Payer: Self-pay | Admitting: Internal Medicine

## 2018-02-25 NOTE — Telephone Encounter (Signed)
Pt is sched for CT 1.31.20.  Pt states that she has lost her paperwork and would like a CB.

## 2018-02-25 NOTE — Telephone Encounter (Signed)
CT instructions reviewed with pt over the phone and questions were answered.

## 2018-02-28 ENCOUNTER — Ambulatory Visit (INDEPENDENT_AMBULATORY_CARE_PROVIDER_SITE_OTHER)
Admission: RE | Admit: 2018-02-28 | Discharge: 2018-02-28 | Disposition: A | Discharge status: Home / Self Care | Payer: 59 | Source: Ambulatory Visit | Attending: Internal Medicine | Admitting: Internal Medicine

## 2018-02-28 ENCOUNTER — Other Ambulatory Visit: Payer: Self-pay

## 2018-02-28 DIAGNOSIS — K5732 Diverticulitis of large intestine without perforation or abscess without bleeding: Secondary | ICD-10-CM

## 2018-02-28 DIAGNOSIS — R110 Nausea: Secondary | ICD-10-CM

## 2018-02-28 DIAGNOSIS — R1030 Lower abdominal pain, unspecified: Secondary | ICD-10-CM | POA: Diagnosis not present

## 2018-02-28 DIAGNOSIS — N289 Disorder of kidney and ureter, unspecified: Secondary | ICD-10-CM

## 2018-02-28 MED ORDER — IOPAMIDOL (ISOVUE-300) INJECTION 61%
100.0000 mL | Freq: Once | INTRAVENOUS | Status: AC | PRN
  Administered 2018-02-28: 100 mL via INTRAVENOUS

## 2018-03-01 DIAGNOSIS — D49519 Neoplasm of unspecified behavior of unspecified kidney: Secondary | ICD-10-CM

## 2018-03-01 HISTORY — DX: Neoplasm of unspecified behavior of unspecified kidney: D49.519

## 2018-03-03 ENCOUNTER — Other Ambulatory Visit: Payer: Self-pay

## 2018-03-03 ENCOUNTER — Telehealth: Payer: Self-pay | Admitting: Internal Medicine

## 2018-03-03 MED ORDER — RIFAXIMIN 550 MG PO TABS: 550.0000 mg | ORAL_TABLET | Freq: Three times a day (TID) | ORAL | 0 refills | Status: DC

## 2018-03-03 NOTE — Progress Notes (Signed)
ri

## 2018-03-03 NOTE — Telephone Encounter (Signed)
Patient provided the number to central scheduling to reschedule her MRI [phone]

## 2018-03-06 ENCOUNTER — Ambulatory Visit (HOSPITAL_COMMUNITY)
Admission: RE | Admit: 2018-03-06 | Discharge: 2018-03-06 | Disposition: A | Discharge status: Home / Self Care | Payer: 59 | Source: Ambulatory Visit | Attending: Internal Medicine | Admitting: Internal Medicine

## 2018-03-06 ENCOUNTER — Ambulatory Visit (HOSPITAL_COMMUNITY): Payer: 59

## 2018-03-06 DIAGNOSIS — N289 Disorder of kidney and ureter, unspecified: Secondary | ICD-10-CM

## 2018-03-06 MED ORDER — GADOBUTROL 1 MMOL/ML IV SOLN
10.0000 mL | Freq: Once | INTRAVENOUS | Status: AC | PRN
  Administered 2018-03-06: 10 mL via INTRAVENOUS

## 2018-03-10 ENCOUNTER — Other Ambulatory Visit (HOSPITAL_COMMUNITY): Payer: Self-pay | Admitting: Urology

## 2018-03-10 ENCOUNTER — Ambulatory Visit (HOSPITAL_COMMUNITY)
Admission: RE | Admit: 2018-03-10 | Discharge: 2018-03-10 | Disposition: A | Discharge status: Home / Self Care | Payer: 59 | Source: Ambulatory Visit | Attending: Urology | Admitting: Urology

## 2018-03-10 DIAGNOSIS — D4101 Neoplasm of uncertain behavior of right kidney: Secondary | ICD-10-CM

## 2018-03-18 ENCOUNTER — Other Ambulatory Visit: Payer: Self-pay | Admitting: Urology

## 2018-03-21 ENCOUNTER — Encounter: Payer: Self-pay | Admitting: *Deleted

## 2018-04-15 ENCOUNTER — Ambulatory Visit: Payer: 59 | Admitting: Internal Medicine

## 2018-05-05 NOTE — Patient Instructions (Addendum)
Tina Adams     Your procedure is scheduled on: 05/16/2018  Report to [facility] Main  Entrance  Report to admitting at 10:30 AM    Call this number if you have problems the morning of surgery [phone]    Remember: Do not eat food or drink liquids :After Midnight.  BRUSH YOUR TEETH MORNING OF SURGERY AND RINSE YOUR MOUTH OUT, NO CHEWING GUM CANDY OR MINTS.     Take these medicines the morning of surgery with A SIP OF WATER: Alprazolam(Xanax)                                 You may not have any metal on your body including hair pins and                Do not wear jewelry, make-up, lotions, powders or perfumes, deodorant             Do not wear nail polish.  Do not shave  48 hours prior to surgery.        Do not bring valuables to the hospital.  [place].  Contacts, dentures or bridgework may not be worn into surgery.      Marland Kitchen     Special Instructions: N/A              Please read over the following fact sheets you were given: _____________________________________________________________________             [facility] - Preparing for Surgery Before surgery, you can play an important role.  Because skin is not sterile, your skin needs to be as free of germs as possible.  You can reduce the number of germs on your skin by washing with CHG (chlorahexidine gluconate) soap before surgery.  CHG is an antiseptic cleaner which kills germs and bonds with the skin to continue killing germs even after washing. Please DO NOT use if you have an allergy to CHG or antibacterial soaps.  If your skin becomes reddened/irritated stop using the CHG and inform your nurse when you arrive at Short Stay. Do not shave (including legs and underarms) for at least 48 hours prior to the first CHG shower.  You may shave your face/neck. Please follow these instructions carefully:  1.  Shower with CHG Soap the night before  surgery and the  morning of Surgery.   2.  If you choose to wash your hair, wash your hair first as usual with your  normal  Shampoo.   3.  After you shampoo, rinse your hair and body thoroughly to remove the  Shampoo  .                        4.  Use CHG as you would any other liquid soap.  You can apply chg directly  to the skin and wash                        Gently with a scrungie or clean washcloth.  5.  Apply the CHG Soap to your body ONLY FROM THE NECK DOWN.   Do not use on face/ open  Wound or open sores. Avoid contact with eyes, ears mouth and genitals (private parts).                       Wash face,  Genitals (private parts) with your normal soap.              6.  Wash thoroughly, paying special attention to the area where your surgery  will be performed .  7.  Thoroughly rinse your body with warm water from the neck down.   8.  DO NOT shower/wash with your normal soap after using and rinsing off  the CHG Soap.                 9.  Pat yourself dry with a clean towel.             10.  Wear clean pajamas.             11.  Place clean sheets on your bed the night of your first shower and do not  sleep with pets.  Day of Surgery : Do not apply any lotions/deodorants the morning of surgery.  Please wear clean clothes to the hospital/surgery center.  FAILURE TO FOLLOW THESE INSTRUCTIONS MAY RESULT IN THE CANCELLATION OF YOUR SURGERY PATIENT SIGNATURE_________________________________  NURSE SIGNATURE__________________________________  ________________________________________________________________________  WHAT IS A BLOOD TRANSFUSION? Blood Transfusion Information  A transfusion is the replacement of blood or some of its parts. Blood is made up of multiple cells which provide different functions.  Red blood cells carry oxygen and are used for blood loss replacement.  White blood cells fight against infection.  Platelets control  bleeding.  Plasma helps clot blood.  Other blood products are available for specialized needs, such as hemophilia or other clotting disorders. BEFORE THE TRANSFUSION  Who gives blood for transfusions?   Healthy volunteers who are fully evaluated to make sure their blood is safe. This is blood bank blood. Transfusion therapy is the safest it has ever been in the practice of medicine. Before blood is taken from a donor, a complete history is taken to make sure that person has no history of diseases nor engages in risky social behavior (examples are intravenous drug use or sexual activity with multiple partners). The donor's travel history is screened to minimize risk of transmitting infections, such as malaria. The donated blood is tested for signs of infectious diseases, such as HIV and hepatitis. The blood is then tested to be sure it is compatible with you in order to minimize the chance of a transfusion reaction. If you or a relative donates blood, this is often done in anticipation of surgery and is not appropriate for emergency situations. It takes many days to process the donated blood. RISKS AND COMPLICATIONS Although transfusion therapy is very safe and saves many lives, the main dangers of transfusion include:   Getting an infectious disease.  Developing a transfusion reaction. This is an allergic reaction to something in the blood you were given. Every precaution is taken to prevent this. The decision to have a blood transfusion has been considered carefully by your caregiver before blood is given. Blood is not given unless the benefits outweigh the risks. AFTER THE TRANSFUSION  Right after receiving a blood transfusion, you will usually feel much better and more energetic. This is especially true if your red blood cells have gotten low (anemic). The transfusion raises the level of the red blood cells which carry oxygen, and this  usually causes an energy increase.  The nurse  administering the transfusion will monitor you carefully for complications. HOME CARE INSTRUCTIONS  No special instructions are needed after a transfusion. You may find your energy is better. Speak with your caregiver about any limitations on activity for underlying diseases you may have. SEEK MEDICAL CARE IF:   Your condition is not improving after your transfusion.  You develop redness or irritation at the intravenous (IV) site. SEEK IMMEDIATE MEDICAL CARE IF:  Any of the following symptoms occur over the next 12 hours:  Shaking chills.  You have a temperature by mouth above 102 F (38.9 C), not controlled by medicine.  Chest, back, or muscle pain.  People around you feel you are not acting correctly or are confused.  Shortness of breath or difficulty breathing.  Dizziness and fainting.  You get a rash or develop hives.  You have a decrease in urine output.  Your urine turns a dark color or changes to pink, red, or brown. Any of the following symptoms occur over the next 10 days:  You have a temperature by mouth above 102 F (38.9 C), not controlled by medicine.  Shortness of breath.  Weakness after normal activity.  The white part of the eye turns yellow (jaundice).  You have a decrease in the amount of urine or are urinating less often.  Your urine turns a dark color or changes to pink, red, or brown. Document Released: 01/13/2000 Document Revised: 04/09/2011 Document Reviewed: 09/01/2007 [facility] Patient Information 2014 [place], [place], [place].  _______________________________________________________________________

## 2018-05-07 ENCOUNTER — Inpatient Hospital Stay (HOSPITAL_COMMUNITY)
Admission: RE | Admit: 2018-05-07 | Discharge: 2018-05-07 | Disposition: A | Discharge status: Home / Self Care | Payer: 59 | Source: Ambulatory Visit

## 2018-05-27 ENCOUNTER — Ambulatory Visit (INDEPENDENT_AMBULATORY_CARE_PROVIDER_SITE_OTHER): Payer: 59

## 2018-05-27 ENCOUNTER — Other Ambulatory Visit: Payer: Self-pay

## 2018-05-27 ENCOUNTER — Ambulatory Visit: Payer: 59 | Admitting: Podiatry

## 2018-05-27 VITALS — BP 140/86 | HR 75 | Temp 98.1°F

## 2018-05-27 DIAGNOSIS — M779 Enthesopathy, unspecified: Secondary | ICD-10-CM | POA: Diagnosis not present

## 2018-05-27 DIAGNOSIS — M7672 Peroneal tendinitis, left leg: Secondary | ICD-10-CM | POA: Diagnosis not present

## 2018-05-27 DIAGNOSIS — M216X9 Other acquired deformities of unspecified foot: Secondary | ICD-10-CM

## 2018-05-27 DIAGNOSIS — G629 Polyneuropathy, unspecified: Secondary | ICD-10-CM

## 2018-05-27 DIAGNOSIS — M79672 Pain in left foot: Secondary | ICD-10-CM

## 2018-05-27 MED ORDER — DICLOFENAC SODIUM 1 % TD GEL: 2.0000 g | Freq: Four times a day (QID) | TRANSDERMAL | 2 refills | Status: DC

## 2018-05-27 NOTE — Progress Notes (Signed)
Subjective:   Patient ID: Tina Adams, female   DOB: 53 y.o.   MRN: [id number]   HPI 53 year old female presents the office today for concerns of left lateral foot pain which is been ongoing for the last 1 month.  She states that she had received some devastating news and to help work out her frustration she decides to go on a long walk. 2-3 days later she started developed pain to the outside aspect of her foot.  She states that she has pain when she puts pressure.  She has started wearing a cam walker that she had at home which is been helpful.  Minimal swelling.  She is been icing.  No other treatment.  Review of Systems  All other systems reviewed and are negative.  Past Medical History:  Diagnosis Date  . Anemia   . Diverticulosis   . Gallstones   . Gastritis   . Hepatic steatosis   . Hyperplastic colon polyp   . Renal neoplasm   . Tubular adenoma of colon   . Tubulovillous adenoma of colon     Past Surgical History:  Procedure Laterality Date  . CHOLECYSTECTOMY    . FOOT SURGERY  1990   plantar fasciitis  . PARTIAL HYSTERECTOMY    . TONSILLECTOMY       Current Outpatient Medications:  .  ALPRAZolam (XANAX) 0.5 MG tablet, Take 0.5 mg by mouth at bedtime as needed for anxiety or sleep. , Disp: , Rfl:  .  dicyclomine (BENTYL) 10 MG capsule, Take 1-2 capsules (10-20 mg total) by mouth every 6 (six) hours as needed for spasms., Disp: 90 capsule, Rfl: 2 .  fexofenadine-pseudoephedrine (ALLEGRA-D 24) 180-240 MG 24 hr tablet, Take 1 tablet by mouth daily., Disp: , Rfl:  .  Multiple Vitamins-Minerals (EMERGEN-C VITAMIN C PO), Take 1 tablet by mouth daily., Disp: , Rfl:  .  rifaximin (XIFAXAN) 550 MG TABS tablet, Take 1 tablet (550 mg total) by mouth 3 (three) times daily., Disp: 42 tablet, Rfl: 0 .  diclofenac sodium (VOLTAREN) 1 % GEL, Apply 2 g topically 4 (four) times daily. Rub into affected area of foot 2 to 4 times daily, Disp: 100 g, Rfl: 2  Allergies  Allergen  Reactions  . Penicillins Anaphylaxis, Hives and Swelling    Did it involve swelling of the face/tongue/throat, SOB, or low BP? Yes Did it involve sudden or severe rash/hives, skin peeling, or any reaction on the inside of your mouth or nose? No Did you need to seek medical attention at a hospital or doctor's office? Yes When did it last happen?2 years ago If all above answers are "NO", may proceed with cephalosporin use.   . Latex Rash  . Sulfa Antibiotics Diarrhea and Nausea Only          Objective:  Physical Exam  General: AAO x3, NAD  Dermatological: Skin is warm, dry and supple bilateral. Nails x 10 are well manicured; remaining integument appears unremarkable at this time. There are no open sores, no preulcerative lesions, no rash or signs of infection present.  Vascular: Dorsalis Pedis artery and Posterior Tibial artery pedal pulses are 2/4 bilateral with immedate capillary fill time. There is no pain with calf compression, swelling, warmth, erythema.   Neruologic: Sensation decreased with Semmes-Weinstein monofilament.  She is previously diagnosed with neuropathy.  Musculoskeletal: There is tenderness palpation directly on the fifth metatarsal base on the left foot.  There is no pain to vibratory sensation.  There is minimal discomfort  in the course the peroneal tendon just at the insertion of the fifth metatarsal base.  No pain on the proximal aspect of the peroneal tendon.  Overall the tendon appears to be intact.  Cavus foot type is present.  Muscular strength 5/5 in all groups tested bilateral.  Gait: Unassisted, Nonantalgic.       Assessment:   53 year old female with left fifth metatarsal base pain, insertional peroneal tendinitis     Plan:  -Treatment options discussed including all alternatives, risks, and complications -Etiology of symptoms were discussed -X-rays were obtained and reviewed with the patient.  No definitive evidence of acute fracture or  stress fracture. -Steroid injection performed.  See procedure note below. -Continue ice the area as well -Continue cam boot for the next 1 to 2 weeks.  If she starts to feel better we will transition back into regular shoe.  Discussed shoe modifications as well. Discussed [employer] shoe.  Procedure: Injection Tendon/Ligament- injection was done at the 5th metatarsal base laterally away from the actual peroneal tendon Discussed alternatives, risks, complications and verbal consent was obtained.  Location: Left fifth metatarsal base Skin Prep: Alcohol  Injectate: 0.5cc 0.5% marcaine plain, 0.5 cc dexamethasone phosphate Disposition: Patient tolerated procedure well. Injection site dressed with a band-aid.  Post-injection care was discussed and return precautions discussed.    Return in about 2 weeks (around 06/10/2018).  Trula Slade DPM

## 2018-05-30 ENCOUNTER — Other Ambulatory Visit: Payer: Self-pay | Admitting: Podiatry

## 2018-05-30 DIAGNOSIS — M779 Enthesopathy, unspecified: Secondary | ICD-10-CM

## 2018-06-05 ENCOUNTER — Other Ambulatory Visit: Payer: Self-pay | Admitting: Urology

## 2018-06-13 ENCOUNTER — Ambulatory Visit: Payer: 59 | Admitting: Podiatry

## 2018-06-30 DIAGNOSIS — C801 Malignant (primary) neoplasm, unspecified: Secondary | ICD-10-CM

## 2018-06-30 HISTORY — DX: Malignant (primary) neoplasm, unspecified: C80.1

## 2018-07-17 NOTE — Patient Instructions (Addendum)
Tina Adams     Your procedure is scheduled on: 07-23-2018   Report to [facility] Main  Entrance  report to admitting at 10:30 AM   YOU NEED TO HAVE A COVID 19 TEST ON__6/20_____ @_______ , THIS TEST MUST BE DONE BEFORE SURGERY, COME TO [place].  ONCE YOUR COVID TEST IS COMPLETED, PLEASE BEGIN THE QUARANTINE INSTRUCTIONS AS OUTLINED IN YOUR HANDOUT.   Call this number if you have problems the morning of surgery [phone]     Remember: Do not eat food or drink after Midnight.          BRUSH YOUR TEETH MORNING OF SURGERY AND RINSE YOUR MOUTH OUT, NO CHEWING GUM CANDY OR MINTS.     Take these medicines the morning of surgery with A SIP OF WATER: NONE                               You may not have any metal on your body including hair pins and              piercings             Do not wear jewelry, make-up, lotions, powders or perfumes, deodorant             Do not wear nail polish.  Do not shave  48 hours prior to surgery.            .   Do not bring valuables to the hospital. [place].  Contacts, dentures or bridgework may not be worn into surgery. .      _____________________________________________________________________             [facility] - Preparing for Surgery  Before surgery, you can play an important role .  Because skin is not sterile, your skin needs to be as free of germs as possible.  You can reduce the number of germs on your skin by washing with CHG (chlorahexidine gluconate) soap before surgery.   CHG is an antiseptic cleaner which kills germs and bonds with the skin to continue killing germs even after washing. Please DO NOT use if you have an allergy to CHG or antibacterial soaps.   If your skin becomes reddened/irritated stop using the CHG and inform your nurse when you arrive at Short Stay. Do not shave (including legs and  underarms) for at least 48 hours prior to the first CHG shower.  Please follow these instructions carefully :  1.  Shower with CHG Soap the night before surgery and the  morning of Surgery.  2.  If you choose to wash your hair, wash your hair first as usual with your  normal  shampoo.  3.  After you shampoo, rinse your hair and body thoroughly to remove the  shampoo.                                        4.  Use CHG as you would any other liquid soap.  You can apply chg directly  to the skin and wash  Gently with a scrungie or clean washcloth.  5.  Apply the CHG Soap to your body ONLY FROM THE NECK DOWN.   Do not use on face/ open                           Wound or open sores. Avoid contact with eyes, ears mouth and genitals (private parts).                       Wash face,  Genitals (private parts) with your normal soap.             6.  Wash thoroughly, paying special attention to the area where your surgery  will be performed.  7.  Thoroughly rinse your body with warm water from the neck down.  8.  DO NOT shower/wash with your normal soap after using and rinsing off  the CHG Soap.                9.  Pat yourself dry with a clean towel.            10.  Wear clean pajamas.            11.  Place clean sheets on your bed the night of your first shower and do not  sleep with pets . Day of Surgery : Do not apply any lotions/deodorants the morning of surgery.  Please wear clean clothes to the hospital/surgery center.   FAILURE TO FOLLOW THESE INSTRUCTIONS MAY RESULT IN THE CANCELLATION OF YOUR SURGERY PATIENT SIGNATURE_________________________________  NURSE SIGNATURE__________________________________  ________________________________________________________________________  WHAT IS A BLOOD TRANSFUSION? Blood Transfusion Information  A transfusion is the replacement of blood or some of its parts. Blood is made up of multiple cells which provide different  functions.  Red blood cells carry oxygen and are used for blood loss replacement.  White blood cells fight against infection.  Platelets control bleeding.  Plasma helps clot blood.  Other blood products are available for specialized needs, such as hemophilia or other clotting disorders. BEFORE THE TRANSFUSION  Who gives blood for transfusions?   Healthy volunteers who are fully evaluated to make sure their blood is safe. This is blood bank blood. Transfusion therapy is the safest it has ever been in the practice of medicine. Before blood is taken from a donor, a complete history is taken to make sure that person has no history of diseases nor engages in risky social behavior (examples are intravenous drug use or sexual activity with multiple partners). The donor's travel history is screened to minimize risk of transmitting infections, such as malaria. The donated blood is tested for signs of infectious diseases, such as HIV and hepatitis. The blood is then tested to be sure it is compatible with you in order to minimize the chance of a transfusion reaction. If you or a relative donates blood, this is often done in anticipation of surgery and is not appropriate for emergency situations. It takes many days to process the donated blood. RISKS AND COMPLICATIONS Although transfusion therapy is very safe and saves many lives, the main dangers of transfusion include:   Getting an infectious disease.  Developing a transfusion reaction. This is an allergic reaction to something in the blood you were given. Every precaution is taken to prevent this. The decision to have a blood transfusion has been considered carefully by your caregiver before blood is given. Blood is not given unless the  benefits outweigh the risks. AFTER THE TRANSFUSION  Right after receiving a blood transfusion, you will usually feel much better and more energetic. This is especially true if your red blood cells have gotten low  (anemic). The transfusion raises the level of the red blood cells which carry oxygen, and this usually causes an energy increase.  The nurse administering the transfusion will monitor you carefully for complications. HOME CARE INSTRUCTIONS  No special instructions are needed after a transfusion. You may find your energy is better. Speak with your caregiver about any limitations on activity for underlying diseases you may have. SEEK MEDICAL CARE IF:   Your condition is not improving after your transfusion.  You develop redness or irritation at the intravenous (IV) site. SEEK IMMEDIATE MEDICAL CARE IF:  Any of the following symptoms occur over the next 12 hours:  Shaking chills.  You have a temperature by mouth above 102 F (38.9 C), not controlled by medicine.  Chest, back, or muscle pain.  People around you feel you are not acting correctly or are confused.  Shortness of breath or difficulty breathing.  Dizziness and fainting.  You get a rash or develop hives.  You have a decrease in urine output.  Your urine turns a dark color or changes to pink, red, or brown. Any of the following symptoms occur over the next 10 days:  You have a temperature by mouth above 102 F (38.9 C), not controlled by medicine.  Shortness of breath.  Weakness after normal activity.  The white part of the eye turns yellow (jaundice).  You have a decrease in the amount of urine or are urinating less often.  Your urine turns a dark color or changes to pink, red, or brown. Document Released: 01/13/2000 Document Revised: 04/09/2011 Document Reviewed: 09/01/2007 [facility] Patient Information 2014 [place], [place].  _______________________________________________________________________

## 2018-07-17 NOTE — Progress Notes (Signed)
CHEST XRAY 03-10-18 EPIC

## 2018-07-18 ENCOUNTER — Encounter (HOSPITAL_COMMUNITY): Payer: Self-pay

## 2018-07-18 ENCOUNTER — Encounter (HOSPITAL_COMMUNITY)
Admission: RE | Admit: 2018-07-18 | Discharge: 2018-07-18 | Disposition: A | Discharge status: Home / Self Care | Payer: 59 | Source: Ambulatory Visit | Attending: Urology | Admitting: Urology

## 2018-07-18 ENCOUNTER — Other Ambulatory Visit: Payer: Self-pay

## 2018-07-18 DIAGNOSIS — Z01812 Encounter for preprocedural laboratory examination: Secondary | ICD-10-CM | POA: Insufficient documentation

## 2018-07-18 DIAGNOSIS — D49511 Neoplasm of unspecified behavior of right kidney: Secondary | ICD-10-CM | POA: Insufficient documentation

## 2018-07-18 DIAGNOSIS — Z1159 Encounter for screening for other viral diseases: Secondary | ICD-10-CM | POA: Diagnosis not present

## 2018-07-18 LAB — CBC
HCT: 48 % — ABNORMAL HIGH (ref 36.0–46.0)
Hemoglobin: 15 g/dL (ref 12.0–15.0)
MCH: 28 pg (ref 26.0–34.0)
MCHC: 31.3 g/dL (ref 30.0–36.0)
MCV: 89.6 fL (ref 80.0–100.0)
Platelets: 266 10*3/uL (ref 150–400)
RBC: 5.36 MIL/uL — ABNORMAL HIGH (ref 3.87–5.11)
RDW: 13.1 % (ref 11.5–15.5)
WBC: 9.6 10*3/uL (ref 4.0–10.5)
nRBC: 0 % (ref 0.0–0.2)

## 2018-07-18 LAB — PROTIME-INR
INR: 1 (ref 0.8–1.2)
Prothrombin Time: 12.6 seconds (ref 11.4–15.2)

## 2018-07-18 LAB — BASIC METABOLIC PANEL
Anion gap: 11 (ref 5–15)
BUN: 12 mg/dL (ref 6–20)
CO2: 24 mmol/L (ref 22–32)
Calcium: 9.1 mg/dL (ref 8.9–10.3)
Chloride: 106 mmol/L (ref 98–111)
Creatinine, Ser: 0.68 mg/dL (ref 0.44–1.00)
GFR calc Af Amer: >60 mL/min (ref >60)
GFR calc non Af Amer: >60 mL/min (ref >60)
Glucose, Bld: 98 mg/dL (ref 70–99)
Potassium: 3.8 mmol/L (ref 3.5–5.1)
Sodium: 141 mmol/L (ref 135–145)

## 2018-07-19 ENCOUNTER — Other Ambulatory Visit (HOSPITAL_COMMUNITY)
Admission: RE | Admit: 2018-07-19 | Discharge: 2018-07-19 | Disposition: A | Discharge status: Home / Self Care | Payer: 59 | Source: Ambulatory Visit | Attending: Urology | Admitting: Urology

## 2018-07-19 DIAGNOSIS — Z01812 Encounter for preprocedural laboratory examination: Secondary | ICD-10-CM | POA: Diagnosis not present

## 2018-07-19 LAB — ABO/RH: ABO/RH(D): B POS

## 2018-07-20 LAB — SARS CORONAVIRUS 2 (TAT 6-24 HRS): SARS Coronavirus 2: NEGATIVE

## 2018-07-23 ENCOUNTER — Encounter (HOSPITAL_COMMUNITY)
Admission: RE | Disposition: A | Discharge status: Home / Self Care | Payer: Self-pay | Source: Home / Self Care | Attending: Urology

## 2018-07-23 ENCOUNTER — Ambulatory Visit (HOSPITAL_COMMUNITY)
Admission: RE | Admit: 2018-07-23 | Discharge: 2018-07-24 | Disposition: A | Discharge status: Home / Self Care | Payer: 59 | Attending: Urology | Admitting: Urology

## 2018-07-23 ENCOUNTER — Ambulatory Visit (HOSPITAL_COMMUNITY): Payer: 59 | Admitting: Physician Assistant

## 2018-07-23 ENCOUNTER — Encounter (HOSPITAL_COMMUNITY): Payer: Self-pay | Admitting: *Deleted

## 2018-07-23 ENCOUNTER — Ambulatory Visit (HOSPITAL_COMMUNITY): Payer: 59 | Admitting: Certified Registered Nurse Anesthetist

## 2018-07-23 ENCOUNTER — Other Ambulatory Visit: Payer: Self-pay

## 2018-07-23 DIAGNOSIS — F419 Anxiety disorder, unspecified: Secondary | ICD-10-CM | POA: Insufficient documentation

## 2018-07-23 DIAGNOSIS — F17210 Nicotine dependence, cigarettes, uncomplicated: Secondary | ICD-10-CM | POA: Diagnosis not present

## 2018-07-23 DIAGNOSIS — N2889 Other specified disorders of kidney and ureter: Secondary | ICD-10-CM | POA: Diagnosis present

## 2018-07-23 DIAGNOSIS — C641 Malignant neoplasm of right kidney, except renal pelvis: Secondary | ICD-10-CM | POA: Insufficient documentation

## 2018-07-23 HISTORY — PX: ROBOTIC ASSITED PARTIAL NEPHRECTOMY: SHX6087

## 2018-07-23 LAB — HEMOGLOBIN AND HEMATOCRIT, BLOOD
HCT: 45.1 % (ref 36.0–46.0)
Hemoglobin: 14.9 g/dL (ref 12.0–15.0)

## 2018-07-23 LAB — TYPE AND SCREEN
ABO/RH(D): B POS
Antibody Screen: NEGATIVE

## 2018-07-23 SURGERY — NEPHRECTOMY, PARTIAL, ROBOT-ASSISTED
Anesthesia: General | Laterality: Right

## 2018-07-23 MED ORDER — HYDROMORPHONE HCL 1 MG/ML IJ SOLN: 0.5000 mg | INTRAMUSCULAR | Status: DC | PRN

## 2018-07-23 MED ORDER — OXYBUTYNIN CHLORIDE 5 MG PO TABS: 5.0000 mg | ORAL_TABLET | Freq: Three times a day (TID) | ORAL | Status: DC | PRN

## 2018-07-23 MED ORDER — FENTANYL CITRATE (PF) 100 MCG/2ML IJ SOLN
INTRAMUSCULAR | Status: AC
  Filled 2018-07-23: qty 2

## 2018-07-23 MED ORDER — FENTANYL CITRATE (PF) 100 MCG/2ML IJ SOLN
INTRAMUSCULAR | Status: DC | PRN
  Administered 2018-07-23: 50 ug via INTRAVENOUS
  Administered 2018-07-23: 100 ug via INTRAVENOUS
  Administered 2018-07-23 (×4): 50 ug via INTRAVENOUS

## 2018-07-23 MED ORDER — BUPIVACAINE LIPOSOME 1.3 % IJ SUSP
20.0000 mL | Freq: Once | INTRAMUSCULAR | Status: DC
  Filled 2018-07-23: qty 20

## 2018-07-23 MED ORDER — LACTATED RINGERS IR SOLN
Status: DC | PRN
  Administered 2018-07-23: 1000 mL

## 2018-07-23 MED ORDER — HYDROMORPHONE HCL 1 MG/ML IJ SOLN: 0.5000 mg | Freq: Four times a day (QID) | INTRAMUSCULAR | Status: DC | PRN

## 2018-07-23 MED ORDER — FENTANYL CITRATE (PF) 250 MCG/5ML IJ SOLN
INTRAMUSCULAR | Status: AC
  Filled 2018-07-23: qty 5

## 2018-07-23 MED ORDER — PROPOFOL 10 MG/ML IV BOLUS
INTRAVENOUS | Status: DC | PRN
  Administered 2018-07-23: 140 mg via INTRAVENOUS

## 2018-07-23 MED ORDER — SUGAMMADEX SODIUM 500 MG/5ML IV SOLN
INTRAVENOUS | Status: AC
  Filled 2018-07-23: qty 5

## 2018-07-23 MED ORDER — ONDANSETRON HCL 4 MG/2ML IJ SOLN: 4.0000 mg | Freq: Once | INTRAMUSCULAR | Status: DC | PRN

## 2018-07-23 MED ORDER — PHENYLEPHRINE HCL (PRESSORS) 10 MG/ML IV SOLN
INTRAVENOUS | Status: AC
  Filled 2018-07-23: qty 1

## 2018-07-23 MED ORDER — OXYCODONE HCL 5 MG PO TABS
5.0000 mg | ORAL_TABLET | ORAL | Status: DC | PRN
  Administered 2018-07-23 – 2018-07-24 (×4): 5 mg via ORAL
  Filled 2018-07-23 (×4): qty 1

## 2018-07-23 MED ORDER — ONDANSETRON HCL 4 MG/2ML IJ SOLN: 4.0000 mg | INTRAMUSCULAR | Status: DC | PRN

## 2018-07-23 MED ORDER — ONDANSETRON HCL 4 MG/2ML IJ SOLN
INTRAMUSCULAR | Status: AC
  Filled 2018-07-23: qty 2

## 2018-07-23 MED ORDER — LACTATED RINGERS IV SOLN
INTRAVENOUS | Status: DC
  Administered 2018-07-23 (×3): via INTRAVENOUS

## 2018-07-23 MED ORDER — CLINDAMYCIN PHOSPHATE 600 MG/50ML IV SOLN
600.0000 mg | Freq: Three times a day (TID) | INTRAVENOUS | Status: DC
  Administered 2018-07-23 – 2018-07-24 (×2): 600 mg via INTRAVENOUS
  Filled 2018-07-23 (×4): qty 50

## 2018-07-23 MED ORDER — DIPHENHYDRAMINE HCL 50 MG/ML IJ SOLN: 12.5000 mg | Freq: Four times a day (QID) | INTRAMUSCULAR | Status: DC | PRN

## 2018-07-23 MED ORDER — HYDROMORPHONE HCL 1 MG/ML IJ SOLN
INTRAMUSCULAR | Status: AC
  Administered 2018-07-23: 0.25 mg via INTRAVENOUS
  Filled 2018-07-23: qty 1

## 2018-07-23 MED ORDER — LIDOCAINE 2% (20 MG/ML) 5 ML SYRINGE
INTRAMUSCULAR | Status: AC
  Filled 2018-07-23: qty 5

## 2018-07-23 MED ORDER — DEXAMETHASONE SODIUM PHOSPHATE 10 MG/ML IJ SOLN
INTRAMUSCULAR | Status: AC
  Filled 2018-07-23: qty 1

## 2018-07-23 MED ORDER — HYDROMORPHONE HCL 1 MG/ML IJ SOLN
INTRAMUSCULAR | Status: AC
  Administered 2018-07-23: 0.5 mg via INTRAVENOUS
  Filled 2018-07-23: qty 2

## 2018-07-23 MED ORDER — DEXAMETHASONE SODIUM PHOSPHATE 10 MG/ML IJ SOLN
INTRAMUSCULAR | Status: DC | PRN
  Administered 2018-07-23: 8 mg via INTRAVENOUS

## 2018-07-23 MED ORDER — SODIUM CHLORIDE 0.9 % IV SOLN
INTRAVENOUS | Status: DC | PRN
  Administered 2018-07-23: 40 ug/min via INTRAVENOUS

## 2018-07-23 MED ORDER — LIDOCAINE 2% (20 MG/ML) 5 ML SYRINGE
INTRAMUSCULAR | Status: DC | PRN
  Administered 2018-07-23: 100 mg via INTRAVENOUS

## 2018-07-23 MED ORDER — HYDROMORPHONE HCL 1 MG/ML IJ SOLN
0.2500 mg | INTRAMUSCULAR | Status: DC | PRN
  Administered 2018-07-23: 0.25 mg via INTRAVENOUS
  Administered 2018-07-23: 0.5 mg via INTRAVENOUS
  Administered 2018-07-23: 0.25 mg via INTRAVENOUS
  Administered 2018-07-23 (×2): 0.5 mg via INTRAVENOUS

## 2018-07-23 MED ORDER — BUPIVACAINE LIPOSOME 1.3 % IJ SUSP
INTRAMUSCULAR | Status: DC | PRN
  Administered 2018-07-23: 20 mL

## 2018-07-23 MED ORDER — HYDROMORPHONE HCL 1 MG/ML IJ SOLN
0.5000 mg | INTRAMUSCULAR | Status: DC | PRN
  Administered 2018-07-23 – 2018-07-24 (×2): 0.5 mg via INTRAVENOUS
  Filled 2018-07-23 (×2): qty 1

## 2018-07-23 MED ORDER — SENNOSIDES-DOCUSATE SODIUM 8.6-50 MG PO TABS
2.0000 | ORAL_TABLET | Freq: Every day | ORAL | Status: DC
  Administered 2018-07-23: 2 via ORAL
  Filled 2018-07-23: qty 2

## 2018-07-23 MED ORDER — SODIUM CHLORIDE 0.9 % IV SOLN
INTRAVENOUS | Status: DC
  Administered 2018-07-23 – 2018-07-24 (×2): via INTRAVENOUS

## 2018-07-23 MED ORDER — SUGAMMADEX SODIUM 500 MG/5ML IV SOLN
INTRAVENOUS | Status: DC | PRN
  Administered 2018-07-23: 250 mg via INTRAVENOUS

## 2018-07-23 MED ORDER — CLINDAMYCIN PHOSPHATE 900 MG/50ML IV SOLN
900.0000 mg | Freq: Once | INTRAVENOUS | Status: AC
  Administered 2018-07-23: 900 mg via INTRAVENOUS
  Filled 2018-07-23: qty 50

## 2018-07-23 MED ORDER — ACETAMINOPHEN 500 MG PO TABS
1000.0000 mg | ORAL_TABLET | Freq: Four times a day (QID) | ORAL | Status: AC
  Administered 2018-07-23 – 2018-07-24 (×4): 1000 mg via ORAL
  Filled 2018-07-23 (×4): qty 2

## 2018-07-23 MED ORDER — ONDANSETRON HCL 4 MG/2ML IJ SOLN
INTRAMUSCULAR | Status: DC | PRN
  Administered 2018-07-23: 4 mg via INTRAVENOUS

## 2018-07-23 MED ORDER — ROCURONIUM BROMIDE 50 MG/5ML IV SOSY
PREFILLED_SYRINGE | INTRAVENOUS | Status: DC | PRN
  Administered 2018-07-23: 10 mg via INTRAVENOUS
  Administered 2018-07-23: 60 mg via INTRAVENOUS
  Administered 2018-07-23: 10 mg via INTRAVENOUS
  Administered 2018-07-23: 20 mg via INTRAVENOUS

## 2018-07-23 MED ORDER — HYDROMORPHONE HCL 1 MG/ML IJ SOLN
0.2500 mg | INTRAMUSCULAR | Status: DC | PRN
  Administered 2018-07-23 (×2): 0.25 mg via INTRAVENOUS

## 2018-07-23 MED ORDER — MEPERIDINE HCL 50 MG/ML IJ SOLN: 6.2500 mg | INTRAMUSCULAR | Status: DC | PRN

## 2018-07-23 MED ORDER — ROCURONIUM BROMIDE 10 MG/ML (PF) SYRINGE
PREFILLED_SYRINGE | INTRAVENOUS | Status: AC
  Filled 2018-07-23: qty 10

## 2018-07-23 MED ORDER — BUPIVACAINE LIPOSOME 1.3 % IJ SUSP: Freq: Once | INTRAMUSCULAR | Status: DC

## 2018-07-23 MED ORDER — MIDAZOLAM HCL 2 MG/2ML IJ SOLN
INTRAMUSCULAR | Status: AC
  Filled 2018-07-23: qty 2

## 2018-07-23 MED ORDER — BUPIVACAINE-EPINEPHRINE 0.25% -1:200000 IJ SOLN
INTRAMUSCULAR | Status: DC | PRN
  Administered 2018-07-23: 15 mL

## 2018-07-23 MED ORDER — MORPHINE SULFATE (PF) 2 MG/ML IV SOLN
2.0000 mg | INTRAVENOUS | Status: DC | PRN
  Administered 2018-07-23: 1 mg via INTRAVENOUS
  Administered 2018-07-23: 2 mg via INTRAVENOUS
  Filled 2018-07-23 (×2): qty 1

## 2018-07-23 MED ORDER — PROPOFOL 10 MG/ML IV BOLUS
INTRAVENOUS | Status: AC
  Filled 2018-07-23: qty 20

## 2018-07-23 MED ORDER — BUPIVACAINE-EPINEPHRINE (PF) 0.25% -1:200000 IJ SOLN
INTRAMUSCULAR | Status: AC
  Filled 2018-07-23: qty 30

## 2018-07-23 MED ORDER — MIDAZOLAM HCL 5 MG/5ML IJ SOLN
INTRAMUSCULAR | Status: DC | PRN
  Administered 2018-07-23: 2 mg via INTRAVENOUS

## 2018-07-23 MED ORDER — DIPHENHYDRAMINE HCL 12.5 MG/5ML PO ELIX: 12.5000 mg | ORAL_SOLUTION | Freq: Four times a day (QID) | ORAL | Status: DC | PRN

## 2018-07-23 MED ORDER — EVICEL 5 ML EX KIT
PACK | Freq: Once | CUTANEOUS | Status: DC
  Filled 2018-07-23: qty 1

## 2018-07-23 SURGICAL SUPPLY — 65 items
APPLICATOR SURGIFLO ENDO (HEMOSTASIS) IMPLANT
CHLORAPREP W/TINT 26 (MISCELLANEOUS) ×2 IMPLANT
CLIP SUT LAPRA TY ABSORB (SUTURE) ×2 IMPLANT
CLIP VESOCCLUDE MED LG 6/CT (CLIP) IMPLANT
CLIP VESOLOCK LG 6/CT PURPLE (CLIP) ×2 IMPLANT
CLIP VESOLOCK MED LG 6/CT (CLIP) ×4 IMPLANT
CLIP VESOLOCK XL 6/CT (CLIP) IMPLANT
COVER BACK TABLE 60X90IN (DRAPES) ×2 IMPLANT
COVER SURGICAL LIGHT HANDLE (MISCELLANEOUS) ×2 IMPLANT
COVER TIP SHEARS 8 DVNC (MISCELLANEOUS) ×1 IMPLANT
COVER TIP SHEARS 8MM DA VINCI (MISCELLANEOUS) ×1
COVER WAND RF STERILE (DRAPES) ×2 IMPLANT
DECANTER SPIKE VIAL GLASS SM (MISCELLANEOUS) ×2 IMPLANT
DERMABOND ADVANCED (GAUZE/BANDAGES/DRESSINGS) ×1
DERMABOND ADVANCED .7 DNX12 (GAUZE/BANDAGES/DRESSINGS) ×1 IMPLANT
DRAIN CHANNEL 15F RND FF 3/16 (WOUND CARE) IMPLANT
DRAPE ARM DVNC X/XI (DISPOSABLE) ×4 IMPLANT
DRAPE COLUMN DVNC XI (DISPOSABLE) ×1 IMPLANT
DRAPE DA VINCI XI ARM (DISPOSABLE) ×4
DRAPE DA VINCI XI COLUMN (DISPOSABLE) ×1
DRAPE INCISE IOBAN 66X45 STRL (DRAPES) ×2 IMPLANT
DRAPE SHEET LG 3/4 BI-LAMINATE (DRAPES) ×2 IMPLANT
ELECT PENCIL ROCKER SW 15FT (MISCELLANEOUS) ×2 IMPLANT
ELECT REM PT RETURN 15FT ADLT (MISCELLANEOUS) ×2 IMPLANT
EVACUATOR SILICONE 100CC (DRAIN) IMPLANT
GLOVE BIOGEL PI IND STRL 8 (GLOVE) ×2 IMPLANT
GLOVE BIOGEL PI INDICATOR 8 (GLOVE) ×2
GOWN STRL REUS W/TWL LRG LVL3 (GOWN DISPOSABLE) ×2 IMPLANT
GOWN STRL REUS W/TWL XL LVL3 (GOWN DISPOSABLE) ×8 IMPLANT
HEMOSTAT SURGICEL 4X8 (HEMOSTASIS) ×2 IMPLANT
IRRIG SUCT STRYKERFLOW 2 WTIP (MISCELLANEOUS) ×2
IRRIGATION SUCT STRKRFLW 2 WTP (MISCELLANEOUS) ×1 IMPLANT
KIT BASIN OR (CUSTOM PROCEDURE TRAY) ×2 IMPLANT
KIT TURNOVER KIT A (KITS) ×2 IMPLANT
MARKER SKIN DUAL TIP RULER LAB (MISCELLANEOUS) ×2 IMPLANT
NEEDLE INSUFFLATION 14GA 120MM (NEEDLE) ×2 IMPLANT
PORT ACCESS TROCAR AIRSEAL 12 (TROCAR) ×1 IMPLANT
PORT ACCESS TROCAR AIRSEAL 5M (TROCAR) ×1
POUCH SPECIMEN RETRIEVAL 10MM (ENDOMECHANICALS) ×2 IMPLANT
PROTECTOR NERVE ULNAR (MISCELLANEOUS) ×4 IMPLANT
RELOAD STAPLER WHITE 60MM (STAPLE) IMPLANT
SEAL CANN UNIV 5-8 DVNC XI (MISCELLANEOUS) ×4 IMPLANT
SEAL XI 5MM-8MM UNIVERSAL (MISCELLANEOUS) ×4
SET TRI-LUMEN FLTR TB AIRSEAL (TUBING) ×2 IMPLANT
SOLUTION ELECTROLUBE (MISCELLANEOUS) ×2 IMPLANT
STAPLE ECHEON FLEX 60 POW ENDO (STAPLE) IMPLANT
STAPLER RELOAD WHITE 60MM (STAPLE)
SURGIFLO W/THROMBIN 8M KIT (HEMOSTASIS) IMPLANT
SUT ETHILON 2 0 PSLX (SUTURE) IMPLANT
SUT MNCRL AB 4-0 PS2 18 (SUTURE) ×4 IMPLANT
SUT PDS AB 0 CT1 36 (SUTURE) IMPLANT
SUT V-LOC BARB 180 2/0GR6 GS22 (SUTURE)
SUT VIC AB 1 CT1 27 (SUTURE) ×4
SUT VIC AB 1 CT1 27XBRD ANTBC (SUTURE) ×4 IMPLANT
SUT VICRYL 0 UR6 27IN ABS (SUTURE) IMPLANT
SUT VLOC BARB 180 ABS3/0GR12 (SUTURE) ×4
SUTURE V-LC BRB 180 2/0GR6GS22 (SUTURE) IMPLANT
SUTURE VLOC BRB 180 ABS3/0GR12 (SUTURE) ×2 IMPLANT
TIP RIGID 35CM EVICEL (HEMOSTASIS) ×2 IMPLANT
TOWEL OR 17X26 10 PK STRL BLUE (TOWEL DISPOSABLE) ×2 IMPLANT
TOWEL OR NON WOVEN STRL DISP B (DISPOSABLE) ×2 IMPLANT
TRAY FOLEY MTR SLVR 14FR STAT (SET/KITS/TRAYS/PACK) ×2 IMPLANT
TRAY LAPAROSCOPIC (CUSTOM PROCEDURE TRAY) ×2 IMPLANT
TROCAR BLADELESS OPT 5 100 (ENDOMECHANICALS) ×2 IMPLANT
WATER STERILE IRR 1000ML POUR (IV SOLUTION) ×2 IMPLANT

## 2018-07-23 NOTE — Anesthesia Postprocedure Evaluation (Signed)
Anesthesia Post Note  Patient: Tina Adams  Procedure(s) Performed: XI ROBOTIC ASSITED PARTIAL NEPHRECTOMY (Right )     Patient location during evaluation: PACU Anesthesia Type: General Level of consciousness: awake and alert Pain management: pain level controlled Vital Signs Assessment: post-procedure vital signs reviewed and stable Respiratory status: spontaneous breathing, nonlabored ventilation, respiratory function stable and patient connected to nasal cannula oxygen Cardiovascular status: blood pressure returned to baseline and stable Postop Assessment: no apparent nausea or vomiting Anesthetic complications: no    Last Vitals:  Vitals:   07/23/18 1611 07/23/18 1636  BP: 130/72 132/71  Pulse: 76 62  Resp: 12 16  Temp:  37.2 C  SpO2: 99% 97%    Last Pain:  Vitals:   07/23/18 1636  TempSrc: Oral  PainSc:                  Shanya Ferriss DAVID

## 2018-07-23 NOTE — Anesthesia Preprocedure Evaluation (Signed)
Anesthesia Evaluation  Patient identified by MRN, date of birth, ID band Patient awake    Reviewed: Allergy & Precautions, NPO status , Patient's Chart, lab work & pertinent test results  Airway Mallampati: I  TM Distance: >3 FB Neck ROM: Full    Dental   Pulmonary former smoker,    Pulmonary exam normal        Cardiovascular Normal cardiovascular exam     Neuro/Psych    GI/Hepatic   Endo/Other    Renal/GU      Musculoskeletal   Abdominal   Peds  Hematology   Anesthesia Other Findings   Reproductive/Obstetrics                             Anesthesia Physical Anesthesia Plan  ASA: II  Anesthesia Plan: General   Post-op Pain Management:    Induction: Intravenous  PONV Risk Score and Plan: 3 and Ondansetron, Midazolam and Treatment may vary due to age or medical condition  Airway Management Planned: Oral ETT  Additional Equipment:   Intra-op Plan:   Post-operative Plan: Extubation in OR  Informed Consent: I have reviewed the patients History and Physical, chart, labs and discussed the procedure including the risks, benefits and alternatives for the proposed anesthesia with the patient or authorized representative who has indicated his/her understanding and acceptance.       Plan Discussed with: CRNA and Surgeon  Anesthesia Plan Comments:         Anesthesia Quick Evaluation

## 2018-07-23 NOTE — Progress Notes (Signed)
Patient c/o severe pain despite morphine and oxycodone given. Urology on call paged and received call back from [place], [place]. Verbal Orders received for 0.5mg  IV Dilaudid Q6 PRN for severe pain and to discontinue morphine order. Orders followed out.

## 2018-07-23 NOTE — Anesthesia Procedure Notes (Signed)
Procedure Name: Intubation Date/Time: 07/23/2018 11:57 AM Performed by: Montel Clock, CRNA Pre-anesthesia Checklist: Patient identified, Emergency Drugs available, Suction available, Patient being monitored and Timeout performed Patient Re-evaluated:Patient Re-evaluated prior to induction Oxygen Delivery Method: Circle system utilized Preoxygenation: Pre-oxygenation with 100% oxygen Induction Type: IV induction Ventilation: Mask ventilation without difficulty and Oral airway inserted - appropriate to patient size Laryngoscope Size: Mac and 3 Grade View: Grade II Tube type: Oral Tube size: 7.0 mm Number of attempts: 1 Airway Equipment and Method: Stylet Placement Confirmation: ETT inserted through vocal cords under direct vision,  positive ETCO2 and breath sounds checked- equal and bilateral Secured at: 22 cm Tube secured with: Tape Dental Injury: Teeth and Oropharynx as per pre-operative assessment

## 2018-07-23 NOTE — Op Note (Signed)
Operative Note  Preoperative diagnosis:  1.  1.5 cm right renal mass  Postoperative diagnosis: 1.  1.5 cm right renal mass  Procedure(s): 1.  Robot-assisted laparoscopic right partial nephrectomy  Surgeon: Ellison Hughs, MD  Assistants:  Tharon Aquas, MD PHY-4  Anesthesia:  General  Complications:  None  EBL:  50 mL  Specimens: 1. Right renal mass  Drains/Catheters: 1.  Foley catheter  Intraoperative findings:   1. Exophytic right lower pole renal mass 2. Right renal defect was hemostatic following repair  Indication:  Tina Adams is a 53 y.o. female with a 1.5 cm solid and enhancing right renal mass with features concerning for renal cell carcinoma.  She has been consented for the above procedures, voices understanding and wishes to proceed.  Description of procedure:  After informed consent was signed, the patient was taken back to the operating room and properly anesthetized.  The patient was then placed in the left lateral decubitus position with all pressure points padded.  The abdomen was then prepped and draped in the usual sterile fashion.  A time-out was then performed.    An 8 mm incision was then made lateral to the right rectus muscle at the level of the right 12th rib.  A Veress needle was then used to access the abdominal cavity.  A saline drop test showed no signs of obstruction and aspiration of the Veress needle revealed no blood or sucus.  The abdominal cavity was then insufflated to 15 mmHg.  An 8 mm robotic trocar was then atraumatically inserted into the abdominal cavity.  The robotic camera was then inserted through the port and inspection of the abdominal cavity revealed no evidence of adjacent organ or vessel injury.  We then placed three additional 8 mm robotic ports, a 15 mm assistant port and a 5 mm subxiphoid port in such as fashion as to triangulate the right renal hilum.  The robot was then docked into postion.   Using a combination of  blunt and cold scissors dissection, the hepatic attachments were released from the abdominal sidewall.  A locking grasper was then inserted through the 5 mm sub-xyphoid port and used to retract the posterior surface of the liver more cephalad.  The white line of Toldt along the ascending colon was then incised, allowing [place] to reflect the colon medially and expose the anterior surface of the right kidney.  The duodenum was then Kocherized medially, which abruptly led [place] to identification of the inferior vena cava.    Once the colon was adequately mobilized, we moved to the lower pole and identified the gonadal vein and ureter.  The gonadal vein was then left running parallel to the vena cava and the right ureter was reflected anteriorly.  Using cautious cautery, the overlying perihilar attachments were then released.  This yielded visualization of the renal hilum, which included a single right renal vein and a single right renal artery.  The perilymphatic tissue surrounding the right renal artery were carefully released so that the right renal artery was fully encircled.    We next turned our attention to defatting the kidney.  An anterior incision along Gerota's fascia was created and the kidney was fully mobilized.   The renal mass is identified at the lower pole.  Using intraoperative ultrasound, the tumor was then carefully evaluated and demonstrated heterogenous echogenicity compared to the rest of the renal parenchyma. The resection margin was marked to allow for wide excision of the renal mass.  The renal  hilum was once again identified and a bulldog clamp was placed.    Using cold scissors, the right renal mass was then carefully excised, leaving a grossly negative margin.  Excision of the mass appeared complete with no tumor grossly remaining.  The tumor was then placed in the right lower quadrant, to be retrieved following repair of the renal defect.  A running 3-0 V lock suture was then used to  reapproximate the resection bed.  Tension was placed with hemo-lock clips.   The right renal capsule was then reapproximated using a 0 Vicryl suture on a CT-1 needle in an interrupted fashion using hemo-lock clips as a buttress.  Lapra-Ty's were then placed on each of the interrupted sutures.  The bulldog was then released, which warm ischemia time totaled 12 minutes.   Once hemostasis was achieved and the renal bed was irrigated, Surgicel and EVICEL was then placed over the renorrhaphy.  Gerota's fascia was then reapproximated with a running v-lok suture and the mesocolonic fat along the descending colon was then reapproximated to the right abdominal sidewall using Hem-o-lok clips.  The renal mass was retrieved and placed in an Endo Catch bag.  The mass was extracted through the 15 mm assistant port.  The fascia of the assistant port was then reapproximated with a 0 Vicryl suture.    The abdomen was desufflated with all ports removed.  The skin was then reapproximated using running Monocryl and dressed appropriately.  The patient was then replaced in the supine position and was awakened from anesthesia without complications.  Plan:  Monitor on the floor.  Bedrest overnight.  ADAT.  Foley out in the AM.  Start sub-cutaneous heparin POD1 if labs are appropriate.

## 2018-07-23 NOTE — Progress Notes (Addendum)
Received second call back from [facility], [place], who gave verbal order to discontinue previous 0.5mg  IV Dilaudid Q6 PRN order. New verbal order received for 0.5-1mg  IV dilaudid Q2-Q3 PRN. Orders placed and will follow out.   Per Larkin Ina in pharmacy unable to do range of Q2-Q3, and order must be placed with the more frequent option given. Order placed for 0.5-1mg  IV Dilaudid Q2 PRN. Salley Slaughter, PA notified and gave verbal order for  0.5-1mg  IV Dilaudid Q2 PRN.

## 2018-07-23 NOTE — Transfer of Care (Signed)
Immediate Anesthesia Transfer of Care Note  Patient: Tina Adams  Procedure(s) Performed: XI ROBOTIC ASSITED PARTIAL NEPHRECTOMY (Right )  Patient Location: PACU  Anesthesia Type:General  Level of Consciousness: awake and patient cooperative  Airway & Oxygen Therapy: Patient Spontanous Breathing and Patient connected to face mask oxygen  Post-op Assessment: Report given to RN and Post -op Vital signs reviewed and stable  Post vital signs: Reviewed and stable  Last Vitals:  Vitals Value Taken Time  BP 142/81 07/23/18 1437  Temp    Pulse 75 07/23/18 1440  Resp 13 07/23/18 1440  SpO2 100 % 07/23/18 1440  Vitals shown include unvalidated device data.  Last Pain:  Vitals:   07/23/18 1004  TempSrc: Oral  PainSc: 7       Patients Stated Pain Goal: 5 (25/49/82 6415)  Complications: No apparent anesthesia complications

## 2018-07-23 NOTE — H&P (Signed)
PRE-OP H&P  CC: Right renal mass   HPI: Tina Adams is a 54 year old female who was recently found to have a 1.5 x 1.5 cm solid and enhancing right renal mass.   The lesion was initially discovered on CT from 01/28/18 during a work-up for LLQ pain. She later had an MRI on 03/06/18 for confirmation.   The patient has no personal/family history of cancer. She does smoke approximately 10 cigarettes per week for the past 40 years. She denies right-sided flank pain or history of gross hematuria. She has no prior history of chronic kidney disease/renal dysfunction.   She denies interval episodes of flank pian, hematuria, dysuria or UTIs. She continues to have intermittent episodes of SUI, but denies any new voiding sxs.   Surgical hx: Lap chole 2012, hystectomy and cystocele repair.     ALLERGIES: Latex penicillian Sulfur    MEDICATIONS: Allegra Allergy  Ibuprofen  Xanax 0.5 mg tablet     GU PSH: Cystocele Repair Hysterectomy      PSH Notes: L foot plantar 1997   NON-GU PSH: Remove Gallbladder Tonsillectomy    GU PMH: Right uncertain neoplasm of kidney, 1.5 cm x 1.5 cm solid enhancing right posterior and inferior renal mass with features concerning for renal cell carcinoma - 03/10/2018    NON-GU PMH: Anxiety Depression GERD Hypercholesterolemia    FAMILY HISTORY: 1 son - No Family History Diabetes - Father Heart Disease - Father, Grandmother, Uncle   SOCIAL HISTORY: Marital Status: Single Preferred Language: English; Race: White Current Smoking Status: Patient smokes occasionally. Smokes 1/2 pack per day.   Tobacco Use Assessment Completed: Used Tobacco in last 30 days? Does not use smokeless tobacco. Drinks 8 drinks per week. Types of alcohol consumed: Wine, Beer.  Drinks 2 caffeinated drinks per day. Has not had a blood transfusion.    REVIEW OF SYSTEMS:    GU Review Female:   Patient reports frequent urination and have to strain to urinate. Patient denies hard to  postpone urination, burning /pain with urination, get up at night to urinate, leakage of urine, stream starts and stops, trouble starting your stream, and being pregnant.  Gastrointestinal (Upper):   Patient denies nausea, vomiting, and indigestion/ heartburn.  Gastrointestinal (Lower):   Patient denies diarrhea and constipation.  Constitutional:   Patient denies fever, night sweats, weight loss, and fatigue.  Skin:   Patient denies skin rash/ lesion and itching.  Eyes:   Patient denies blurred vision and double vision.  Ears/ Nose/ Throat:   Patient denies sore throat and sinus problems.  Hematologic/Lymphatic:   Patient denies easy bruising and swollen glands.  Cardiovascular:   Patient denies leg swelling and chest pains.  Respiratory:   Patient denies cough and shortness of breath.  Endocrine:   Patient denies excessive thirst.  Musculoskeletal:   Patient reports back pain. Patient denies joint pain.  Neurological:   Patient denies headaches and dizziness.  Psychologic:   Patient denies depression and anxiety.   VITAL SIGNS:      06/26/2018 09:44 AM  Weight 22 lb / 9.98 kg  Height 69 in / 175.26 cm  BP 150/83 mmHg  Heart Rate 76 /min  Temperature 98.3 F / 36.8 C  BMI 3.2 kg/m   MULTI-SYSTEM PHYSICAL EXAMINATION:    Constitutional: Well-nourished. No physical deformities. Normally developed. Good grooming.  Neck: Neck symmetrical, not swollen. Normal tracheal position.  Skin: No paleness, no jaundice, no cyanosis. No lesion, no ulcer, no rash.  Neurologic /  Psychiatric: Oriented to time, oriented to place, oriented to person. No depression, no anxiety, no agitation.  Gastrointestinal: Obese abdomen. No mass, no tenderness, no rigidity.   Eyes: Normal conjunctivae. Normal eyelids.  Musculoskeletal: Normal gait and station of head and neck.     PAST DATA REVIEWED:  Source Of History:  Patient   PROCEDURES:          Urinalysis w/Scope Dipstick Dipstick Cont'd Micro  Color:  Yellow Bilirubin: Neg mg/dL WBC/hpf: 0 - 5/hpf  Appearance: Clear Ketones: Neg mg/dL RBC/hpf: 0 - 2/hpf  Specific Gravity: <=1.005 Blood: 1+ ery/uL Bacteria: NS (Not Seen)  pH: 6.0 Protein: Neg mg/dL Cystals: NS (Not Seen)  Glucose: Neg mg/dL Urobilinogen: 0.2 mg/dL Casts: NS (Not Seen)    Nitrites: Neg Trichomonas: Not Present    Leukocyte Esterase: Neg leu/uL Mucous: Not Present      Epithelial Cells: 0 - 5/hpf      Yeast: NS (Not Seen)      Sperm: Not Present    ASSESSMENT:      ICD-10 Details  1 GU:   Right renal neoplasm - 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 We discussed that the mass in question has features concerning for malignancy. I explained the natural history of presumed renal cell carcinoma. I reviewed the AUA guidelines for evaluation and treatment of the small renal mass. The options of active surveillance, in situ tumor ablation, partial and radical nephrectomy was discussed. The risks of robotic partial nephrectomy were discussed in detail including but not limited to: negative pathology, open conversion, completion nephrectomy, infection of the urinary tract/skin/abdominal cavity, VTE, MI/CVA, lymphatic leak, injury to adjacent solid/hollow viscus organs, bleeding requiring a blood transfusion, catastrophic bleeding, hernia formation, need for postoperative angioembolization, urinary leak requiring stent/drain, and other imponderables.   -The patient voices understanding and wishes to proceed with a robotic-assisted laparoscopic right partial nephrectomy

## 2018-07-24 ENCOUNTER — Encounter (HOSPITAL_COMMUNITY): Payer: Self-pay | Admitting: Urology

## 2018-07-24 DIAGNOSIS — C641 Malignant neoplasm of right kidney, except renal pelvis: Secondary | ICD-10-CM | POA: Diagnosis not present

## 2018-07-24 LAB — BASIC METABOLIC PANEL
Anion gap: 9 (ref 5–15)
BUN: 11 mg/dL (ref 6–20)
CO2: 24 mmol/L (ref 22–32)
Calcium: 8.3 mg/dL — ABNORMAL LOW (ref 8.9–10.3)
Chloride: 104 mmol/L (ref 98–111)
Creatinine, Ser: 0.7 mg/dL (ref 0.44–1.00)
GFR calc Af Amer: >60 mL/min (ref >60)
GFR calc non Af Amer: >60 mL/min (ref >60)
Glucose, Bld: 136 mg/dL — ABNORMAL HIGH (ref 70–99)
Potassium: 4.1 mmol/L (ref 3.5–5.1)
Sodium: 137 mmol/L (ref 135–145)

## 2018-07-24 LAB — CBC
HCT: 43.2 % (ref 36.0–46.0)
Hemoglobin: 13.8 g/dL (ref 12.0–15.0)
MCH: 28.9 pg (ref 26.0–34.0)
MCHC: 31.9 g/dL (ref 30.0–36.0)
MCV: 90.4 fL (ref 80.0–100.0)
Platelets: 227 10*3/uL (ref 150–400)
RBC: 4.78 MIL/uL (ref 3.87–5.11)
RDW: 13 % (ref 11.5–15.5)
WBC: 15.8 10*3/uL — ABNORMAL HIGH (ref 4.0–10.5)
nRBC: 0 % (ref 0.0–0.2)

## 2018-07-24 MED ORDER — HYDROCODONE-ACETAMINOPHEN 5-325 MG PO TABS: 1.0000 | ORAL_TABLET | ORAL | 0 refills | Status: DC | PRN

## 2018-07-24 MED ORDER — HYDROCODONE-ACETAMINOPHEN 5-325 MG PO TABS: 1.0000 | ORAL_TABLET | ORAL | 0 refills | Status: AC | PRN

## 2018-07-24 MED ORDER — SENNOSIDES-DOCUSATE SODIUM 8.6-50 MG PO TABS: 2.0000 | ORAL_TABLET | Freq: Every day | ORAL | 1 refills | Status: AC

## 2018-07-24 MED ORDER — ONDANSETRON HCL 4 MG PO TABS: 4.0000 mg | ORAL_TABLET | Freq: Three times a day (TID) | ORAL | 0 refills | Status: AC | PRN

## 2018-07-24 NOTE — Progress Notes (Signed)
Urology Progress Note   53 y.o. female s/p right partial nephrectomy on 6/25.  1 Day Post-Op   Subjective: Some post op pain that has now resolved. Tolerated small amount of food. No N/V.    Objective: Vital signs in last 24 hours: Temp:  [97.6 F (36.4 C)-99 F (37.2 C)] 97.8 F (36.6 C) (06/25 0508) Pulse Rate:  [58-84] 58 (06/25 0508) Resp:  [10-18] 14 (06/25 0508) BP: (113-146)/(60-99) 114/74 (06/25 0508) SpO2:  [96 %-100 %] 96 % (06/25 0508)  Intake/Output from previous day: 06/24 0701 - 06/25 0700 In: 5444.8 [P.O.:960; I.V.:4384.8; IV Piggyback:100] Out: 1450 [Urine:1400; Blood:50] Intake/Output this shift: No intake/output data recorded.  Physical Exam:  General: Alert and oriented CV: RRR Lungs: Clear Abdomen: Soft, appropriately tender. Incision c/d/i GU: Foley in place draining clear yellow urine  Ext: NT, No erythema  Lab Results: Recent Labs    07/23/18 1511 07/24/18 0538  HGB 14.9 13.8  HCT 45.1 43.2   BMET Recent Labs    07/24/18 0538  NA 137  K 4.1  CL 104  CO2 24  GLUCOSE 136*  BUN 11  CREATININE 0.70  CALCIUM 8.3*     Studies/Results: No results found.  Assessment/Plan:  53 y.o. female s/p right partial nephrectomy.  Overall doing well post-op.   - d/c foley - d/c fluids - ambulate - regular diet - Likely discharge in PM   Dispo: Floor   LOS: 0 days   Tharon Aquas 07/24/2018, 7:20 AM

## 2018-07-24 NOTE — Discharge Summary (Signed)
[facility] Discharge Summary  Admit date: 07/23/2018  Discharge date and time: 07/24/18   Discharge to: Home  Discharge Service: Urology  Discharge Attending Physician:  Dr. Lovena Neighbours  Discharge  Diagnoses: Renal Mass  Secondary Diagnosis: Active Problems:   Renal mass   OR Procedures: Procedure(s): XI ROBOTIC ASSITED PARTIAL NEPHRECTOMY 07/23/2018   Ancillary Procedures: None   Discharge Day Services:  The patient was seen and examined by the Urology team both in the morning and immediately prior to discharge.  Vital signs and laboratory values were stable and within normal limits.  The physical exam was benign and unchanged and all surgical wounds were examined.  Discharge instructions were explained and all questions answered.  Subjective  No acute events overnight. Pain Controlled. No fever or chills.  Objective Patient Vitals for the past 8 hrs:  BP Temp Temp src Pulse Resp SpO2  07/24/18 1012 121/76 98.5 F (36.9 C) Oral (!) 56 20 96 %  07/24/18 0508 114/74 97.8 F (36.6 C) Oral (!) 58 14 96 %   Total I/O In: 160 [P.O.:160] Out: 400 [Urine:400]  General Appearance:        No acute distress Lungs:                       Normal work of breathing on room air Heart:                                Regular rate and rhythm Abdomen:                         Soft, non-tender, non-distended, Incision c/d/i Extremities:                      Warm and well perfused   Hospital Course:  The patient underwent right partial nephrectomy on 07/23/2018.  The patient tolerated the procedure well, was extubated in the OR, and afterwards was taken to the PACU for routine post-surgical care. When stable the patient was transferred to the floor.     The patient did well postoperatively.  The patient's diet was slowly advanced and at the time of discharge was tolerating a regular diet.  The patient was discharged home 1 Day Post-Op, at which point was tolerating a regular solid diet,  was able to void spontaneously, have adequate pain control with P.O. pain medication, and could ambulate without difficulty. The patient will follow up with [place] for post op check.   Uncomplicated hospital course. Will follow up in 2 weeks  Condition at Discharge: Improved  Discharge Medications:  Allergies as of 07/24/2018      Reactions   Penicillins Anaphylaxis, Hives, Swelling   Did it involve swelling of the face/tongue/throat, SOB, or low BP? Yes Did it involve sudden or severe rash/hives, skin peeling, or any reaction on the inside of your mouth or nose? No Did you need to seek medical attention at a hospital or doctor's office? Yes When did it last happen?2 years ago If all above answers are "NO", may proceed with cephalosporin use.   Latex Rash   Sulfa Antibiotics Diarrhea, Nausea Only      Medication List    TAKE these medications   acetaminophen 650 MG CR tablet Commonly known as: TYLENOL Take 1,300 mg by mouth at bedtime as needed for pain.   ALPRAZolam 0.5 MG tablet Commonly  known as: XANAX Take 0.5 mg by mouth at bedtime as needed for anxiety or sleep.   diclofenac sodium 1 % Gel Commonly known as: VOLTAREN Apply 2 g topically 4 (four) times daily. Rub into affected area of foot 2 to 4 times daily   dicyclomine 10 MG capsule Commonly known as: BENTYL Take 1-2 capsules (10-20 mg total) by mouth every 6 (six) hours as needed for spasms.   fexofenadine 180 MG tablet Commonly known as: ALLEGRA Take 180 mg by mouth daily.   HYDROcodone-acetaminophen 5-325 MG tablet Commonly known as: NORCO/VICODIN Take 1 tablet by mouth every 4 (four) hours as needed for up to 7 days for moderate pain.   ondansetron 4 MG tablet Commonly known as: Zofran Take 1 tablet (4 mg total) by mouth every 8 (eight) hours as needed for up to 10 days for nausea or vomiting.   rifaximin 550 MG Tabs tablet Commonly known as: XIFAXAN Take 1 tablet (550 mg total) by mouth 3 (three)  times daily.   senna-docusate 8.6-50 MG tablet Commonly known as: Senokot-S Take 2 tablets by mouth daily. While taking pain medication      Discharge Instructions   None

## 2018-07-29 ENCOUNTER — Telehealth: Payer: Self-pay | Admitting: *Deleted

## 2018-07-29 NOTE — Telephone Encounter (Signed)
Called pt- She states she was going to call us to RS her colon- she thinks too soon from 6-24 surgery-  She has a F/U with the surgeon 7-9 and she will ask about RS date- If too soon, she will call and RS again-  RS to PV 8-10 Monday 8 am  Colon 8-24 Monday 730 am With JMP_  I cancelled PV 7-2 and colon 7-22

## 2018-07-29 NOTE — Telephone Encounter (Signed)
Dr Hilarie Fredrickson,  This pt had a partial nephrectomy 07-23-2018. She is scheduled for a colon with you 08-20-2018 for a hx of polyps- last colon 07-2017 with a 1 yr recall- 12 polyps TA, TVA  Should she Have a colon already that close to this surgery? It will not have been 1 month since this procedure  Please advise, Thanks Lelan Pons

## 2018-07-29 NOTE — Telephone Encounter (Signed)
Would ask the patient to ask her urology who performed her kidney surgery about colonoscopy scheduled for 7/22 If he or she advises her to wait, then I would delay about 1 month, if he/she is okay with her proceeding with colonoscopy, then so am I Thank you

## 2018-08-20 ENCOUNTER — Encounter: Payer: 59 | Admitting: Internal Medicine

## 2018-09-16 ENCOUNTER — Encounter: Payer: Self-pay | Admitting: Internal Medicine

## 2018-09-22 ENCOUNTER — Encounter: Payer: 59 | Admitting: Internal Medicine

## 2018-09-25 ENCOUNTER — Ambulatory Visit: Payer: 59

## 2018-09-25 ENCOUNTER — Other Ambulatory Visit: Payer: Self-pay

## 2018-09-25 VITALS — Ht 69.0 in | Wt 200.0 lb

## 2018-09-25 DIAGNOSIS — D369 Benign neoplasm, unspecified site: Secondary | ICD-10-CM

## 2018-09-25 MED ORDER — SUPREP BOWEL PREP KIT 17.5-3.13-1.6 GM/177ML PO SOLN: 1.0000 | Freq: Once | ORAL | 0 refills | Status: AC

## 2018-09-25 NOTE — Progress Notes (Signed)
Per pt, no allergies to soy or egg products.Pt not taking any weight loss meds or using  O2 at home.  Pt denies sedation problems. Pt refused emmi video. The PV was done over the phone due to COVID-19. Verified pt's address and insurance. Reviewed medical hx and prep instructions with pt and will mail paperwork. Informed pt to call with any questions or changes prior to her procedure. She understood.

## 2018-09-29 ENCOUNTER — Encounter: Payer: Self-pay | Admitting: Internal Medicine

## 2018-10-01 ENCOUNTER — Other Ambulatory Visit: Payer: Self-pay | Admitting: Internal Medicine

## 2018-10-09 ENCOUNTER — Encounter: Payer: 59 | Admitting: Internal Medicine

## 2018-10-30 ENCOUNTER — Telehealth: Payer: Self-pay

## 2018-10-30 NOTE — Telephone Encounter (Signed)
Pt answered "NO" to all Covid Questons.

## 2018-10-30 NOTE — Telephone Encounter (Signed)
Covid-19 screening questions   Do you now or have you had a fever in the last 14 days?  Do you have any respiratory symptoms of shortness of breath or cough now or in the last 14 days?  Do you have any family members or close contacts with diagnosed or suspected Covid-19 in the past 14 days?  Have you been tested for Covid-19 and found to be positive?       

## 2018-10-31 ENCOUNTER — Other Ambulatory Visit: Payer: Self-pay

## 2018-10-31 ENCOUNTER — Encounter: Payer: Self-pay | Admitting: Internal Medicine

## 2018-10-31 ENCOUNTER — Ambulatory Visit (AMBULATORY_SURGERY_CENTER): Payer: 59 | Admitting: Internal Medicine

## 2018-10-31 VITALS — BP 125/91 | HR 68 | Temp 98.8°F | Resp 11 | Ht 69.0 in | Wt 200.0 lb

## 2018-10-31 DIAGNOSIS — D125 Benign neoplasm of sigmoid colon: Secondary | ICD-10-CM

## 2018-10-31 DIAGNOSIS — D120 Benign neoplasm of cecum: Secondary | ICD-10-CM

## 2018-10-31 DIAGNOSIS — D123 Benign neoplasm of transverse colon: Secondary | ICD-10-CM

## 2018-10-31 DIAGNOSIS — K635 Polyp of colon: Secondary | ICD-10-CM

## 2018-10-31 DIAGNOSIS — D369 Benign neoplasm, unspecified site: Secondary | ICD-10-CM

## 2018-10-31 MED ORDER — SODIUM CHLORIDE 0.9 % IV SOLN: 500.0000 mL | Freq: Once | INTRAVENOUS | Status: DC

## 2018-10-31 NOTE — Patient Instructions (Signed)
YOU HAD AN ENDOSCOPIC PROCEDURE TODAY AT THE [facility]:   Refer to the procedure report that was given to you for any specific questions about what was found during the examination.  If the procedure report does not answer your questions, please call your gastroenterologist to clarify.  If you requested that your care partner not be given the details of your procedure findings, then the procedure report has been included in a sealed envelope for you to review at your convenience later.  YOU SHOULD EXPECT: Some feelings of bloating in the abdomen. Passage of more gas than usual.  Walking can help get rid of the air that was put into your GI tract during the procedure and reduce the bloating. If you had a lower endoscopy (such as a colonoscopy or flexible sigmoidoscopy) you may notice spotting of blood in your stool or on the toilet paper. If you underwent a bowel prep for your procedure, you may not have a normal bowel movement for a few days.  Please Note:  You might notice some irritation and congestion in your nose or some drainage.  This is from the oxygen used during your procedure.  There is no need for concern and it should clear up in a day or so.  SYMPTOMS TO REPORT IMMEDIATELY:   Following lower endoscopy (colonoscopy or flexible sigmoidoscopy):  Excessive amounts of blood in the stool  Significant tenderness or worsening of abdominal pains  Swelling of the abdomen that is new, acute  Fever of 100F or higher  For urgent or emergent issues, a gastroenterologist can be reached at any hour by calling [phone].   DIET:  We do recommend a small meal at first, but then you may proceed to your regular diet.  Drink plenty of fluids but you should avoid alcoholic beverages for 24 hours.  ACTIVITY:  You should plan to take it easy for the rest of today and you should NOT DRIVE or use heavy machinery until tomorrow (because of the sedation medicines used during the test).     FOLLOW UP: Our staff will call the number listed on your records 48-72 hours following your procedure to check on you and address any questions or concerns that you may have regarding the information given to you following your procedure. If we do not reach you, we will leave a message.  We will attempt to reach you two times.  During this call, we will ask if you have developed any symptoms of COVID 19. If you develop any symptoms (ie: fever, flu-like symptoms, shortness of breath, cough etc.) before then, please call [phone].  If you test positive for Covid 19 in the 2 weeks post procedure, please call and report this information to us.    If any biopsies were taken you will be contacted by phone or by letter within the next 1-3 weeks.  Please call us at [phone] if you have not heard about the biopsies in 3 weeks.    SIGNATURES/CONFIDENTIALITY: You and/or your care partner have signed paperwork which will be entered into your electronic medical record.  These signatures attest to the fact that that the information above on your After Visit Summary has been reviewed and is understood.  Full responsibility of the confidentiality of this discharge information lies with you and/or your care-partner. 

## 2018-10-31 NOTE — Progress Notes (Signed)
Called to room to assist during endoscopic procedure.  Patient ID and intended procedure confirmed with present staff. Received instructions for my participation in the procedure from the performing physician.  

## 2018-10-31 NOTE — Op Note (Signed)
[facility] Patient Name: Tina Adams Procedure Date: 10/31/2018 4:17 PM MRN: [id number] Endoscopist: Jerene Bears , MD Age: 53 Referring MD:  Date of Birth: 01/18/1966 Gender: Female Account #: [id number] Procedure:                Colonoscopy Indications:              High risk colon cancer surveillance: Personal                            history of multiple colonic adenomas and sessile                            serrated colon polyps, Last colonoscopy: July 2019 Medicines:                Monitored Anesthesia Care Procedure:                Pre-Anesthesia Assessment:                           - Prior to the procedure, a History and Physical                            was performed, and patient medications and                            allergies were reviewed. The patient's tolerance of                            previous anesthesia was also reviewed. The risks                            and benefits of the procedure and the sedation                            options and risks were discussed with the patient.                            All questions were answered, and informed consent                            was obtained. Prior Anticoagulants: The patient has                            taken no previous anticoagulant or antiplatelet                            agents. ASA Grade Assessment: II - A patient with                            mild systemic disease. After reviewing the risks                            and benefits, the patient was deemed in  satisfactory condition to undergo the procedure.                           After obtaining informed consent, the colonoscope                            was passed under direct vision. Throughout the                            procedure, the patient's blood pressure, pulse, and                            oxygen saturations were monitored continuously. The                            Colonoscope  was introduced through the anus and                            advanced to the cecum, identified by appendiceal                            orifice and ileocecal valve. The colonoscopy was                            performed without difficulty. The patient tolerated                            the procedure well. The quality of the bowel                            preparation was good. The ileocecal valve,                            appendiceal orifice, and rectum were photographed. Scope In: 4:25:46 PM Scope Out: 4:42:57 PM Scope Withdrawal Time: 0 hours 12 minutes 6 seconds  Total Procedure Duration: 0 hours 17 minutes 11 seconds  Findings:                 The digital rectal exam was normal.                           A 3 mm polyp was found in the cecum. The polyp was                            sessile. The polyp was removed with a cold snare.                            Resection and retrieval were complete.                           Two sessile polyps were found in the transverse                            colon. The polyps were 3 to  4 mm in size. These                            polyps were removed with a cold snare. Resection                            and retrieval were complete.                           Two sessile polyps were found in the sigmoid colon.                            The polyps were 3 to 4 mm in size. These polyps                            were removed with a cold snare. Resection and                            retrieval were complete.                           Multiple small and large-mouthed diverticula were                            found in the sigmoid colon, descending colon and                            transverse colon.                           Internal hemorrhoids were found during                            retroflexion. The hemorrhoids were small. Complications:            No immediate complications. Estimated Blood Loss:     Estimated blood loss was  minimal. Impression:               - One 3 mm polyp in the cecum, removed with a cold                            snare. Resected and retrieved.                           - Two 3 to 4 mm polyps in the transverse colon,                            removed with a cold snare. Resected and retrieved.                           - Two 3 to 4 mm polyps in the sigmoid colon,                            removed with a cold snare. Resected and retrieved.                           -  Severe diverticulosis in the sigmoid colon, in                            the descending colon and in the transverse colon.                           - Small internal hemorrhoids. Recommendation:           - Patient has a contact number available for                            emergencies. The signs and symptoms of potential                            delayed complications were discussed with the                            patient. Return to normal activities tomorrow.                            Written discharge instructions were provided to the                            patient.                           - Resume previous diet.                           - Continue present medications.                           - Await pathology results.                           - Repeat colonoscopy is recommended for                            surveillance. The colonoscopy date will be                            determined after pathology results from today's                            exam become available for review. Jerene Bears, MD 10/31/2018 4:50:22 PM This report has been signed electronically.

## 2018-10-31 NOTE — Progress Notes (Signed)
Report given to PACU, vss 

## 2018-10-31 NOTE — Progress Notes (Signed)
Pt's states no medical or surgical changes since previsit or office visit.  VITALS  Tina Adams TEMP [place]

## 2018-11-04 ENCOUNTER — Telehealth: Payer: Self-pay

## 2018-11-04 NOTE — Telephone Encounter (Signed)
  Follow up Call-  Call back number 10/31/2018 08/27/2017  Post procedure Call Back phone  # [phone]  Permission to leave phone message Yes Yes  Some recent data might be hidden     Patient questions:  Do you have a fever, pain , or abdominal swelling? No. Pain Score  0 *  Have you tolerated food without any problems? Yes.    Have you been able to return to your normal activities? Yes.    Do you have any questions about your discharge instructions: Diet   No. Medications  No. Follow up visit  No.  Do you have questions or concerns about your Care? No.  Actions: * If pain score is 4 or above: No action needed, pain <4.  1. Have you developed a fever since your procedure? no  2.   Have you had an respiratory symptoms (SOB or cough) since your procedure? no  3.   Have you tested positive for COVID 19 since your procedure no  4.   Have you had any family members/close contacts diagnosed with the COVID 19 since your procedure?  no   If yes to any of these questions please route to Joylene John, RN and Alphonsa Gin, Therapist, sports.

## 2018-11-07 ENCOUNTER — Encounter: Payer: Self-pay | Admitting: Internal Medicine

## 2018-11-17 ENCOUNTER — Other Ambulatory Visit (HOSPITAL_COMMUNITY): Payer: Self-pay | Admitting: Urology

## 2018-11-17 ENCOUNTER — Other Ambulatory Visit: Payer: Self-pay

## 2018-11-17 ENCOUNTER — Ambulatory Visit (HOSPITAL_COMMUNITY)
Admission: RE | Admit: 2018-11-17 | Discharge: 2018-11-17 | Disposition: A | Discharge status: Home / Self Care | Payer: 59 | Source: Ambulatory Visit | Attending: Urology | Admitting: Urology

## 2018-11-17 DIAGNOSIS — C641 Malignant neoplasm of right kidney, except renal pelvis: Secondary | ICD-10-CM

## 2019-11-24 ENCOUNTER — Ambulatory Visit (HOSPITAL_COMMUNITY)
Admission: RE | Admit: 2019-11-24 | Discharge: 2019-11-24 | Disposition: A | Discharge status: Home / Self Care | Payer: 59 | Source: Ambulatory Visit | Attending: Urology | Admitting: Urology

## 2019-11-24 ENCOUNTER — Other Ambulatory Visit: Payer: Self-pay

## 2019-11-24 ENCOUNTER — Other Ambulatory Visit (HOSPITAL_COMMUNITY): Payer: Self-pay | Admitting: Urology

## 2019-11-24 DIAGNOSIS — C641 Malignant neoplasm of right kidney, except renal pelvis: Secondary | ICD-10-CM

## 2020-02-09 ENCOUNTER — Other Ambulatory Visit: Payer: 59

## 2020-02-09 DIAGNOSIS — Z20822 Contact with and (suspected) exposure to covid-19: Secondary | ICD-10-CM

## 2020-02-11 ENCOUNTER — Other Ambulatory Visit: Payer: 59

## 2020-02-12 ENCOUNTER — Telehealth: Payer: Self-pay | Admitting: Family Medicine

## 2020-02-12 LAB — NOVEL CORONAVIRUS, NAA: SARS-CoV-2, NAA: DETECTED — AB

## 2020-02-12 NOTE — Telephone Encounter (Signed)
Patient called back for COVID test results. However, patient did see results on MyChart.

## 2020-02-13 ENCOUNTER — Telehealth: Payer: Self-pay | Admitting: Family

## 2020-02-13 NOTE — Telephone Encounter (Signed)
Called to discuss with patient about COVID-19 symptoms and the use of one of the available treatments for those with mild to moderate Covid symptoms and at a high risk of hospitalization.  Pt appears to qualify for outpatient treatment due to co-morbid conditions and/or a member of an at-risk group in accordance with the FDA Emergency Use Authorization.    Symptom onset:  Vaccinated: No (per chart) Booster?  Immunocompromised?  Qualifiers:BMI of 34, High SVI risk score, history of renal cell carcinoma  I was unable to reach Ms. Draighn via phone and left a voicemail with the call back number. MyChart message has also been sent.   Terri Piedra, NP 02/13/2020 11:00 AM

## 2020-02-15 ENCOUNTER — Other Ambulatory Visit: Payer: Self-pay

## 2020-02-15 ENCOUNTER — Encounter (HOSPITAL_BASED_OUTPATIENT_CLINIC_OR_DEPARTMENT_OTHER): Payer: Self-pay

## 2020-02-15 ENCOUNTER — Other Ambulatory Visit (HOSPITAL_BASED_OUTPATIENT_CLINIC_OR_DEPARTMENT_OTHER): Payer: Self-pay | Admitting: Student

## 2020-02-15 ENCOUNTER — Emergency Department (HOSPITAL_BASED_OUTPATIENT_CLINIC_OR_DEPARTMENT_OTHER): Payer: 59

## 2020-02-15 ENCOUNTER — Emergency Department (HOSPITAL_BASED_OUTPATIENT_CLINIC_OR_DEPARTMENT_OTHER)
Admission: EM | Admit: 2020-02-15 | Discharge: 2020-02-15 | Disposition: A | Discharge status: Home / Self Care | Payer: 59 | Attending: Emergency Medicine | Admitting: Emergency Medicine

## 2020-02-15 DIAGNOSIS — Z91040 Latex allergy status: Secondary | ICD-10-CM | POA: Diagnosis not present

## 2020-02-15 DIAGNOSIS — U071 COVID-19: Secondary | ICD-10-CM

## 2020-02-15 DIAGNOSIS — Z85528 Personal history of other malignant neoplasm of kidney: Secondary | ICD-10-CM | POA: Insufficient documentation

## 2020-02-15 DIAGNOSIS — Z87891 Personal history of nicotine dependence: Secondary | ICD-10-CM | POA: Insufficient documentation

## 2020-02-15 DIAGNOSIS — I10 Essential (primary) hypertension: Secondary | ICD-10-CM | POA: Diagnosis not present

## 2020-02-15 DIAGNOSIS — R509 Fever, unspecified: Secondary | ICD-10-CM | POA: Diagnosis present

## 2020-02-15 DIAGNOSIS — J45909 Unspecified asthma, uncomplicated: Secondary | ICD-10-CM | POA: Diagnosis not present

## 2020-02-15 MED ORDER — PREDNISONE 10 MG PO TABS: 10.0000 mg | ORAL_TABLET | Freq: Every day | ORAL | 0 refills | Status: DC

## 2020-02-15 MED ORDER — ONDANSETRON HCL 4 MG PO TABS: 4.0000 mg | ORAL_TABLET | Freq: Three times a day (TID) | ORAL | 0 refills | Status: DC | PRN

## 2020-02-15 MED FILL — predniSONE 10 MG TABS: 10 | 5 days supply | Qty: 5 | Fill #0

## 2020-02-15 MED FILL — ONDANSETRON HCL 4 MG TABLET: 4 | 4 days supply | Qty: 12 | Fill #0

## 2020-02-15 NOTE — ED Triage Notes (Signed)
Pt c/o cough, fever-tested +covid 1/11-NAD-steady gait

## 2020-02-15 NOTE — ED Provider Notes (Signed)
[facility] EMERGENCY DEPARTMENT Provider Note   CSN: [id number] Arrival date & time: 02/15/20  1137     History Chief Complaint  Patient presents with  . Cough    +covid    Tina Adams is a 55 y.o. female.  HPI   Patient with significant medical history of renal cell carcinoma, hypertension, asthma presents with chief complaint of COVID symptoms.  Patient states symptoms started proximately on Tuesday and  tested positive for COVID on Wednesday.  She says she was started to feel better on Friday but now feels worse than before.  She endorses fevers, chills, headaches, ear pain, sore throat, productive cough, diarrhea, general body aches.  Patient endorses that she is not vaccine against COVID-19, is not immunocompromise.  She been taking over-the-counter pain medication without relief, she states she is tolerating p.o. difficulty.  She denies any sort of alleviating factors.  Patient denies chest pain, shortness of breath, worsening pedal edema.  Past Medical History:  Diagnosis Date  . Allergy   . Anemia   . Cancer ([place]) 06/2018   renal carcinoma  . Diverticulosis   . Gallstones   . Gastritis   . Hepatic steatosis   . Hyperplastic colon polyp   . Renal neoplasm 03/2018  . Tubular adenoma of colon   . Tubulovillous adenoma of colon     Patient Active Problem List   Diagnosis Date Noted  . Renal mass 07/23/2018  . Plantar fasciitis, bilateral 12/09/2013    Past Surgical History:  Procedure Laterality Date  . ABDOMINAL HYSTERECTOMY    . BLADDER REPAIR  2017   "bladder tacking" 2017 or 2018  . CHOLECYSTECTOMY    . FOOT SURGERY  1990   plantar fasciitis  . PARTIAL HYSTERECTOMY    . ROBOTIC ASSITED PARTIAL NEPHRECTOMY Right 07/23/2018   Procedure: XI ROBOTIC ASSITED PARTIAL NEPHRECTOMY;  Surgeon: Ceasar Mons, MD;  Location: [facility] ORS;  Service: Urology;  Laterality: Right;  . TONSILLECTOMY       OB History   No obstetric history on file.      Family History  Problem Relation Age of Onset  . COPD Mother   . Colon polyps Mother        in her 63s  . Diabetes Father   . Heart disease Father   . Hyperlipidemia Father   . Heart disease Maternal Grandmother   . Healthy Sister   . Colon cancer Neg Hx   . Esophageal cancer Neg Hx   . Stomach cancer Neg Hx   . Pancreatic cancer Neg Hx     Social History   Tobacco Use  . Smoking status: Former Smoker    Packs/day: 0.25    Types: Cigarettes    Quit date: 07/09/2018    Years since quitting: 1.6  . Smokeless tobacco: Never Used  Substance Use Topics  . Alcohol use: Yes    Alcohol/week: 0.0 standard drinks    Comment: socially  . Drug use: Never    Home Medications Prior to Admission medications   Medication Sig Start Date End Date Taking? Authorizing Provider  acetaminophen (TYLENOL) 650 MG CR tablet Take 1,300 mg by mouth at bedtime as needed for pain.    [provider]  ALPRAZolam Duanne Moron) 0.5 MG tablet Take 0.5 mg by mouth at bedtime as needed for anxiety or sleep.  09/23/13   [provider]  dicyclomine (BENTYL) 10 MG capsule Take 1-2 capsules (10-20 mg total) by mouth every 6 (six)  hours as needed for spasms. Patient not taking: Reported on 07/18/2018 02/18/18   Jerene Bears, MD  fexofenadine (ALLEGRA) 180 MG tablet Take 180 mg by mouth daily.    [provider]  ondansetron (ZOFRAN) 4 MG tablet Take 1 tablet (4 mg total) by mouth every 8 (eight) hours as needed for nausea or vomiting. 02/15/20   Marcello Fennel, PA-C  predniSONE (DELTASONE) 10 MG tablet Take 1 tablet (10 mg total) by mouth daily for 5 days. 02/15/20 02/20/20  Marcello Fennel, PA-C  rifaximin (XIFAXAN) 550 MG TABS tablet Take 1 tablet (550 mg total) by mouth 3 (three) times daily. Patient not taking: Reported on 07/18/2018 03/03/18   Jerene Bears, MD    Allergies    Penicillins, Latex, and Sulfa antibiotics  Review of Systems   Review of Systems  Constitutional:  Positive for chills and fever.  HENT: Positive for congestion and sore throat.   Respiratory: Positive for cough. Negative for shortness of breath.   Cardiovascular: Negative for chest pain.  Gastrointestinal: Positive for diarrhea. Negative for abdominal pain, nausea and vomiting.  Genitourinary: Negative for enuresis and flank pain.  Musculoskeletal: Positive for myalgias. Negative for back pain.  Skin: Negative for rash.  Neurological: Positive for headaches. Negative for dizziness.  Hematological: Does not bruise/bleed easily.    Physical Exam Updated Vital Signs BP (!) 153/92 (BP Location: Right Arm)   Pulse 93   Temp 98.4 F (36.9 C) (Oral)   Resp 19   Ht 5\' 9"  (1.753 m)   Wt 107.8 kg   SpO2 98%   BMI 35.10 kg/m   Physical Exam Vitals and nursing note reviewed.  Constitutional:      General: She is not in acute distress.    Appearance: She is not ill-appearing.  HENT:     Head: Normocephalic and atraumatic.     Right Ear: Tympanic membrane, ear canal and external ear normal.     Left Ear: Tympanic membrane, ear canal and external ear normal.     Nose: No congestion.     Mouth/Throat:     Pharynx: Oropharynx is clear. No oropharyngeal exudate or posterior oropharyngeal erythema.  Eyes:     Conjunctiva/sclera: Conjunctivae normal.  Cardiovascular:     Rate and Rhythm: Normal rate and regular rhythm.     Pulses: Normal pulses.     Heart sounds: No murmur heard. No friction rub. No gallop.   Pulmonary:     Effort: No respiratory distress.     Breath sounds: No wheezing, rhonchi or rales.  Abdominal:     Palpations: Abdomen is soft.     Tenderness: There is no abdominal tenderness.  Musculoskeletal:     Right lower leg: No edema.     Left lower leg: No edema.  Skin:    General: Skin is warm and dry.  Neurological:     Mental Status: She is alert.  Psychiatric:        Mood and Affect: Mood normal.     ED Results / Procedures / Treatments   Labs (all  labs ordered are listed, but only abnormal results are displayed) Labs Reviewed - No data to display  EKG None  Radiology DG Chest Portable 1 View  Result Date: 02/15/2020 CLINICAL DATA:  Fever and cough COVID positive since 01/11 EXAM: PORTABLE CHEST 1 VIEW COMPARISON:  Chest radiograph November 24, 2019 FINDINGS: The heart size and mediastinal contours are within normal limits. Mild patchy basilar and  perihilar predominant interstitial opacities. The visualized skeletal structures are unremarkable. IMPRESSION: Mild patchy basilar and perihilar predominant interstitial opacities, consistent with atypical/viral infection such as COVID-19 pneumonia. Electronically Signed   By: Dahlia Bailiff MD   On: 02/15/2020 12:35    Procedures Procedures (including critical care time)  Medications Ordered in ED Medications - No data to display  ED Course  I have reviewed the triage vital signs and the nursing notes.  Pertinent labs & imaging results that were available during my care of the patient were reviewed by me and considered in my medical decision making (see chart for details).    MDM Rules/Calculators/A&P                          Patient presents with COVID symptoms.  She is alert, does not appear in acute distress, vital signs reassuring.  Due to well-appearing patient, benign physical exam further lab work and imaging not warranted at this time.  Chest x-ray was ordered in triage.  Chest x-ray shows patchy bibasilar and perihilar predominant interstitial opacities consistent with COVID  Low suspicion for systemic infection as patient is nontoxic-appearing, vital signs reassuring, no obvious source infection noted on exam. I have low suspicion for PE as patient denies pleuritic chest pain, shortness of breath vital signs are reassuring.  low suspicion for strep throat as oropharynx was visualized, no erythema or exudates noted.  Low suspicion patient would need  hospitalized due to viral  infection or Covid as vital signs reassuring, patient is not in respiratory distress.  Suspect patient's symptoms are secondary due to COVID, will provide patient with Zofran for nausea control, prednisone for chest tightness and have her follow-up with post-COVID care for further evaluation.  Patient fortunately is outside the window for infusion at this time.  Vital signs have remained stable, no indication for hospital admission.  Patient given at home care as well strict return precautions.  Patient verbalized that they understood agreed to said plan.   Final Clinical Impression(s) / ED Diagnoses Final diagnoses:  COVID    Rx / DC Orders ED Discharge Orders         Ordered    predniSONE (DELTASONE) 10 MG tablet  Daily,   Status:  Discontinued        02/15/20 1405    ondansetron (ZOFRAN) 4 MG tablet  Every 8 hours PRN,   Status:  Discontinued        02/15/20 1405    ondansetron (ZOFRAN) 4 MG tablet  Every 8 hours PRN        02/15/20 1408    predniSONE (DELTASONE) 10 MG tablet  Daily        02/15/20 1408           Aron Baba 02/15/20 1426    Wyvonnia Dusky, MD 02/15/20 1700

## 2020-02-15 NOTE — Discharge Instructions (Signed)
You are seen here for COVID symptoms. I have started you on steroids please take as prescribed. Also wrote you prescription for Zofran please use as needed for nausea. I recommend taking Tylenol for fever control and ibuprofen for pain control please follow dosing on the back of bottle.  I recommend staying hydrated and if you do not an appetite, I recommend soups as this will provide you with fluids and calories.    you are Covid positive you must self quarantine for 5 days starting on symptom onset, if at the end of those 5 days you are feeling better you may return back to school/work, if you continue to have symptoms you must self quarantine for additional 5 days.  I would like you to contact "post Covid care" as they will provide you with information how to manage your Covid symptoms.   Come back to the emergency department if you develop chest pain, shortness of breath, severe abdominal pain, uncontrolled nausea, vomiting, diarrhea.

## 2020-02-19 IMAGING — MR MR ABDOMEN WO/W CM
9 of 18 series · 21 of 48 positions shown · IV contrast (gadavist)
Comparison: 02/28/2018

CLINICAL DATA: Suspicious right kidney lower pole lesion on prior
CT, for further characterization.

EXAM:
MRI ABDOMEN WITHOUT AND WITH CONTRAST
TECHNIQUE: Multiplanar multisequence MR imaging of the abdomen was performed
both before and after the administration of intravenous contrast.
CONTRAST:  10 cc Gadavist

[Series 3: DWI b500 · axial · 6.0mm · 1.48mm/px · z∈[-125,+164]mm · 2 of 76 slices shown]
[im 1/76]
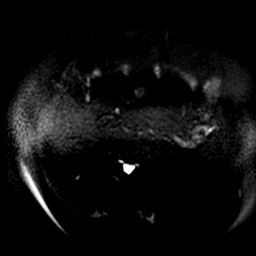
[im 76/76]
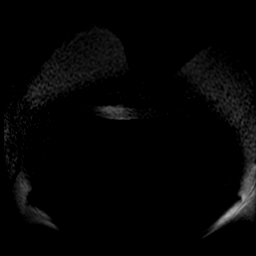

[Series 4: T2 fat-sat · axial · 5.0mm · 0.78mm/px · z∈[-130,+150]mm · 2 of 57 slices shown]
[im 1/57]
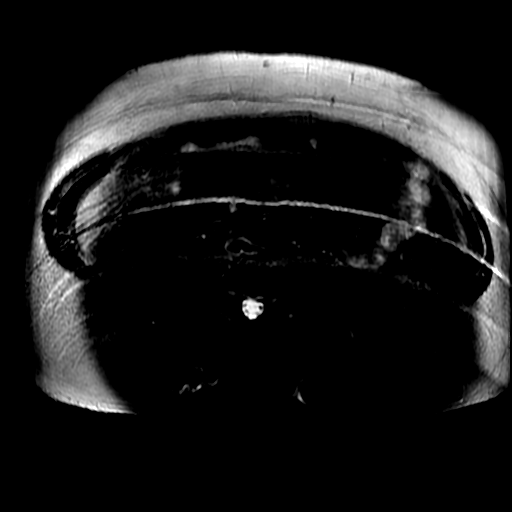
[im 57/57]
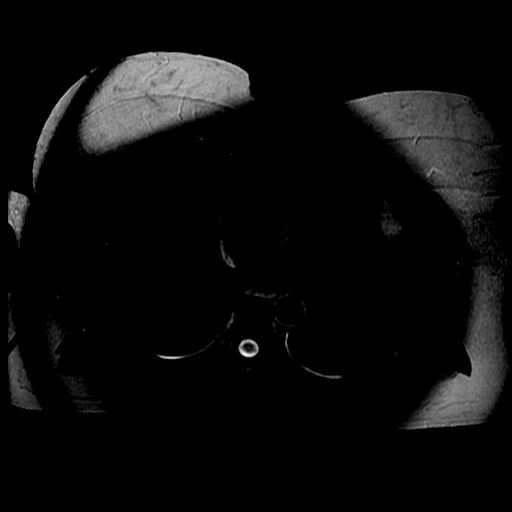

[Series 5: T2 · axial · 5.0mm · 0.78mm/px · z∈[-129,+151]mm · 2 of 57 slices shown (1 of 2)]
[im 1/57]
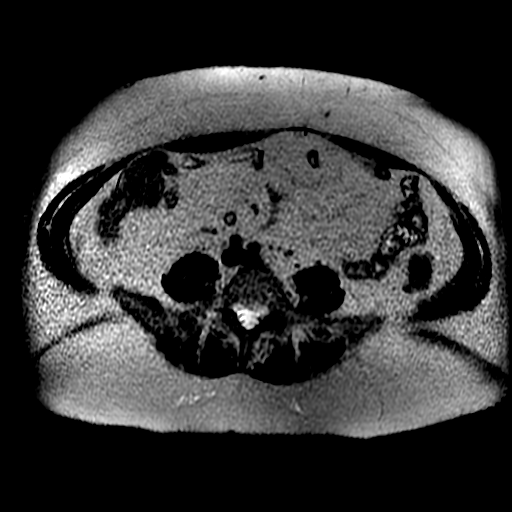
[im 57/57]
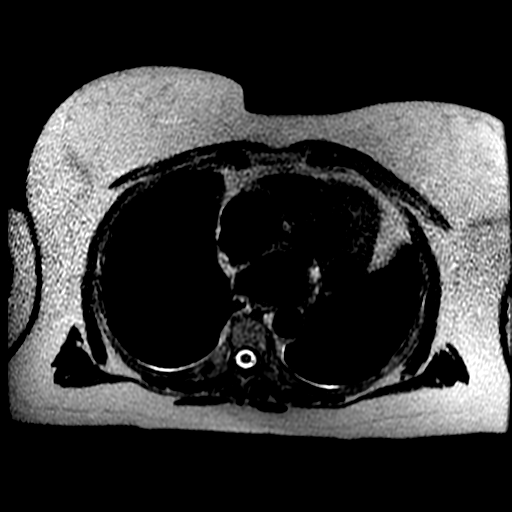

[Series 6: T2 · coronal · 5.0mm · 0.86mm/px · 2 of 49 slices shown (2 of 2)]
[im 1/49]
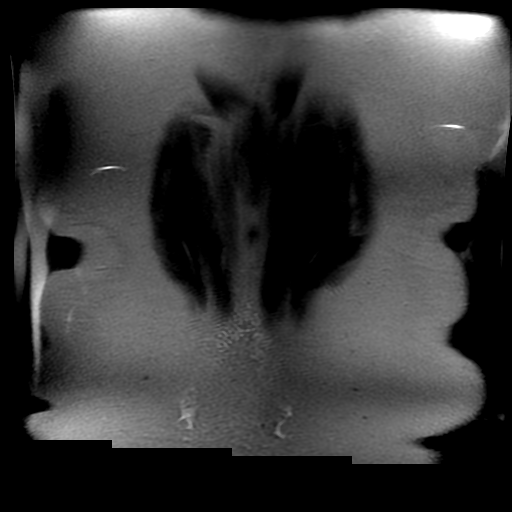
[im 49/49]
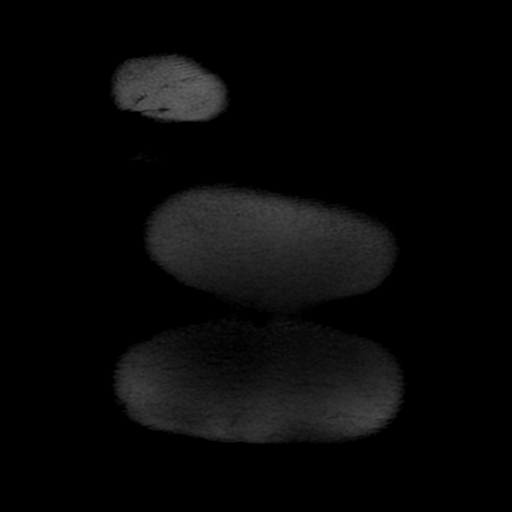

[Series 7: bSSFP · axial · 5.0mm · 0.78mm/px · z∈[-130,+150]mm · 2 of 57 slices shown]
[im 1/57]
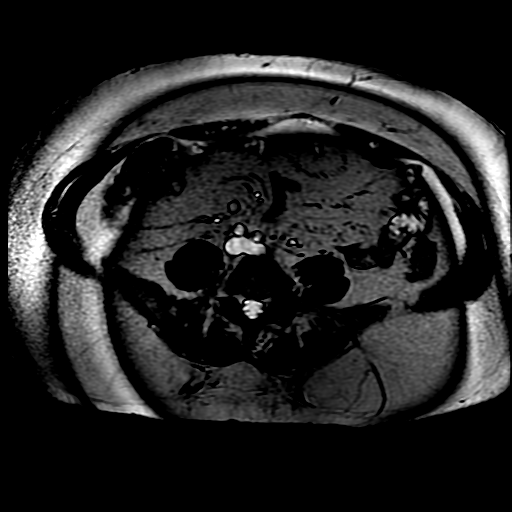
[im 57/57]
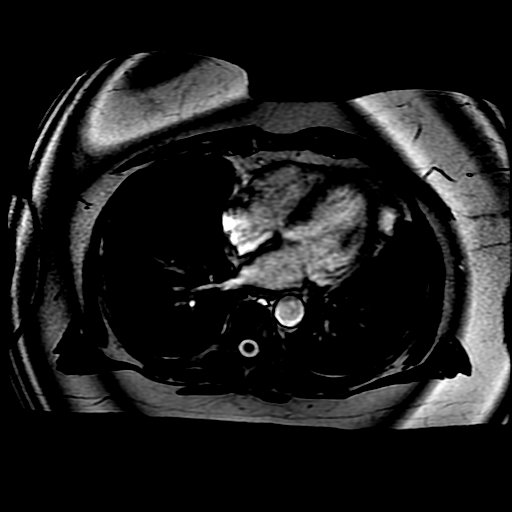

[Series 8: ax dualecho bh · axial · 5.0mm · 0.78mm/px · z∈[-131,+164]mm · 4 of 120 slices shown]
[im 1/120]
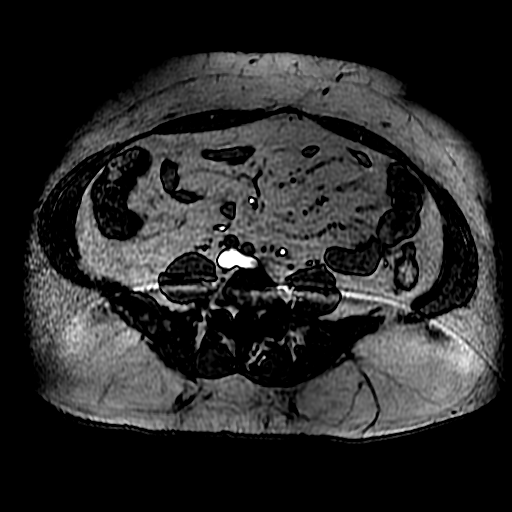
[im 40/120]
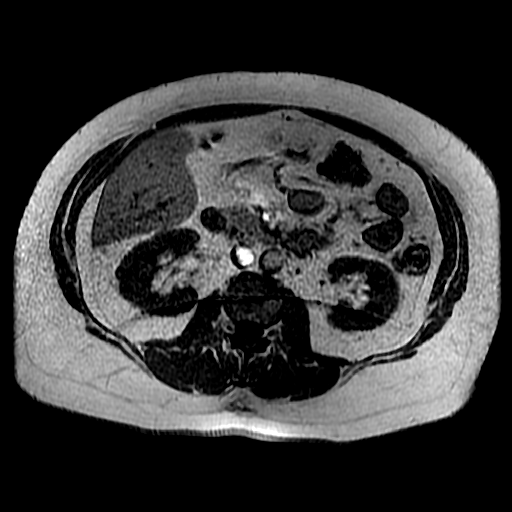
[im 80/120]
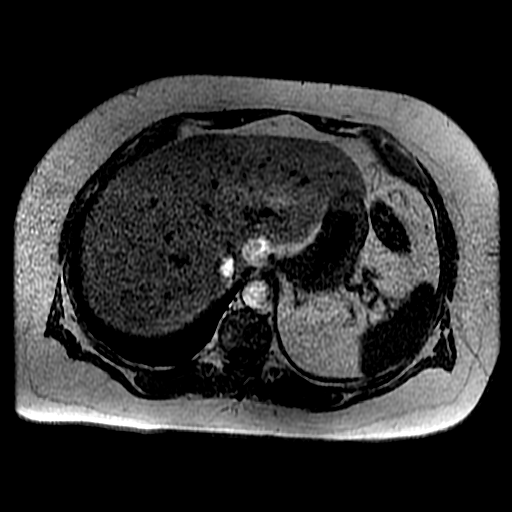
[im 120/120]
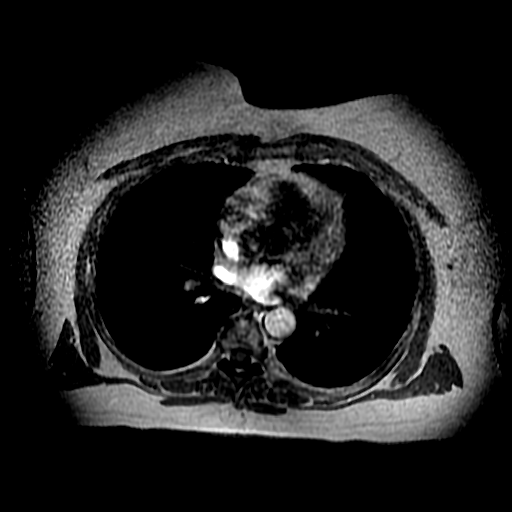

[Series 300: DWI · axial · 6.0mm · 1.48mm/px · 1 of 38 slices shown]
[im 1/38]
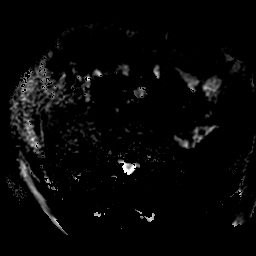

[Series 1000: T1 dynamic · axial · 6.0mm · 0.78mm/px · z∈[-143,+142]mm · 3 of 96 slices shown (1 of 2)]
[im 1/96]
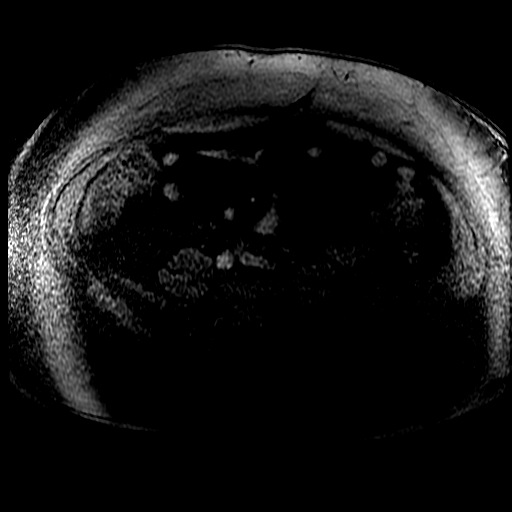
[im 48/96]
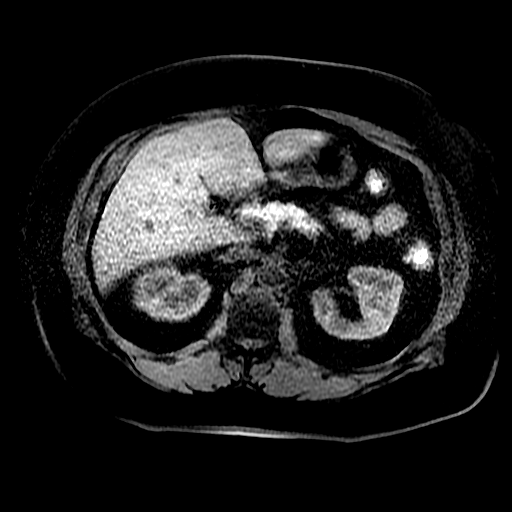
[im 96/96]
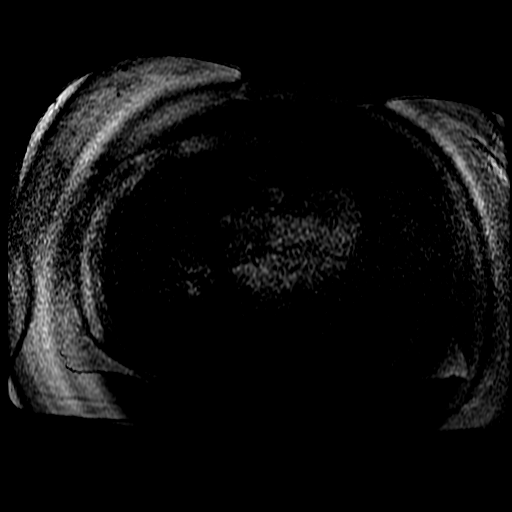

[Series 1001: T1 dynamic · axial · 6.0mm · 0.78mm/px · z∈[-143,+142]mm · 3 of 96 slices shown (2 of 2)]
[im 1/96]
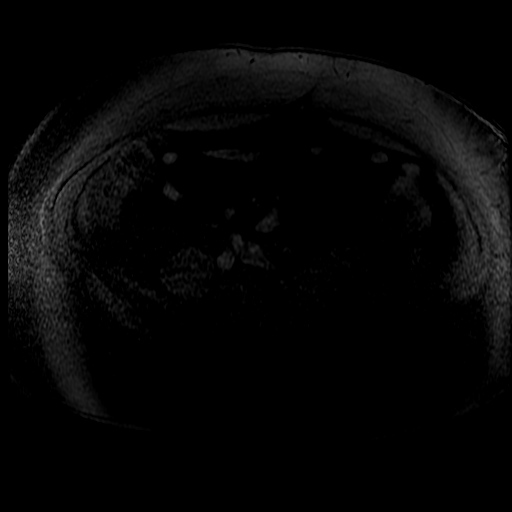
[im 48/96]
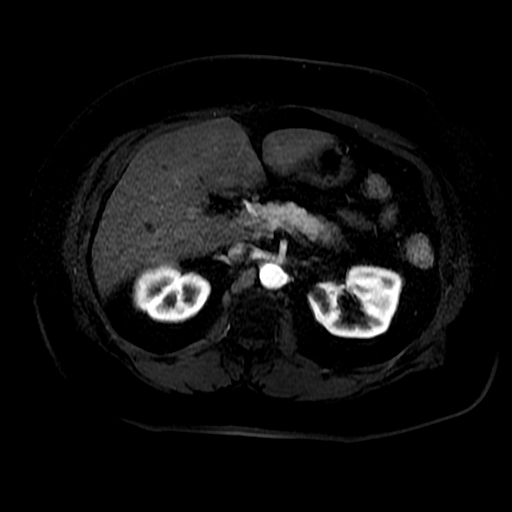
[im 96/96]
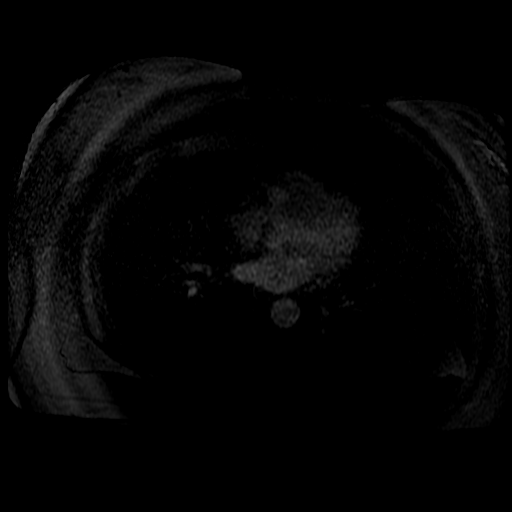

[21 of 48 positions shown; findings below may reference images not displayed]

FINDINGS: Lower chest: Unremarkable

Hepatobiliary: Diffuse hepatic steatosis. Tiny cyst in segment 5 of
the liver on image [id number].

Pancreas:  Unremarkable

Spleen:  Unremarkable

Adrenals/Urinary Tract: 1.5 by 1.6 cm enhancing mass of the right
kidney lower pole posteriorly. Intermediate T2 signal
characteristics similar to that of the rest of kidney.

Additional small benign bilateral renal cysts.

Adrenal glands normal.

Stomach/Bowel: Descending colon diverticulosis.

Vascular/Lymphatic: No pathologic adenopathy. No appreciable tumor
thrombus in the right renal vein.

Other:  No supplemental non-categorized findings.

Musculoskeletal: Mild levoconvex lumbar scoliosis with mild lumbar
degenerative disc disease at L4-5 at L5-S1.
IMPRESSION: 1. 1.6 cm enhancing mass of the right kidney lower pole, compatible
with renal cell carcinoma. No adenopathy or tumor thrombus in the
right renal vein. No findings of metastatic disease in the upper
abdomen.
2. Diffuse hepatic steatosis.
3. Descending colon diverticulosis.

## 2020-02-22 ENCOUNTER — Other Ambulatory Visit: Payer: 59

## 2020-08-30 ENCOUNTER — Ambulatory Visit: Payer: 59 | Admitting: Podiatry

## 2020-08-30 ENCOUNTER — Ambulatory Visit (INDEPENDENT_AMBULATORY_CARE_PROVIDER_SITE_OTHER): Payer: 59

## 2020-08-30 ENCOUNTER — Other Ambulatory Visit: Payer: Self-pay

## 2020-08-30 DIAGNOSIS — M79671 Pain in right foot: Secondary | ICD-10-CM

## 2020-08-30 DIAGNOSIS — M722 Plantar fascial fibromatosis: Secondary | ICD-10-CM

## 2020-08-30 DIAGNOSIS — M216X9 Other acquired deformities of unspecified foot: Secondary | ICD-10-CM

## 2020-08-30 NOTE — Patient Instructions (Signed)

## 2020-09-05 NOTE — Progress Notes (Signed)
Subjective: 55 year old female presents the office today for concerns of discomfort to the bottom of her heel that goes into the arch of the foot into the toes on the right foot.  This is all over the last 3 weeks.  She said it is uncomfortable in the morning and gets better throughout the day with activity.  She has a history of plantar fascial surgery several years ago.  She does have neuropathy.  No recent injury.  No other concerns today.  Denies any fevers or chills or any other concerns.  Objective: AAO x3, NAD DP/PT pulses palpable bilaterally, CRT less than 3 seconds Tenderness palpation on plantar medial tubercle of the calcaneus and insertion of plantar fascial right side.  She does get some discomfort the arch of the foot.  There is no area of pinpoint tenderness.  No edema, erythema.  Cavus foot type is present.  Negative Tinel sign. No open lesions or pre-ulcerative lesions.  No pain with calf compression, swelling, warmth, erythema  Assessment: Plantar fasciitis, right foot  Plan: -All treatment options discussed with the patient including all alternatives, risks, complications.  -X-rays obtained reviewed.  No evidence of acute fracture or stress fracture. -Steroid injection performed in the right foot.  See procedure note below. -Continue stretching, icing daily.  Discussed shoes and arch supports. -Patient encouraged to call the office with any questions, concerns, change in symptoms.   Procedure: Injection Tendon/Ligament Discussed alternatives, risks, complications and verbal consent was obtained.  Location: Right plantar fascia at the glabrous junction; medial approach. Skin Prep: Alcohol. Injectate: 0.5cc 0.5% marcaine plain, 0.5 cc 2% lidocaine plain and, 1 cc kenalog 10. Disposition: Patient tolerated procedure well. Injection site dressed with a band-aid.  Post-injection care was discussed and return precautions discussed.    Trula Slade DPM

## 2020-09-27 ENCOUNTER — Ambulatory Visit: Payer: 59 | Admitting: Podiatry

## 2020-11-18 ENCOUNTER — Other Ambulatory Visit (HOSPITAL_COMMUNITY): Payer: Self-pay | Admitting: Urology

## 2020-11-18 ENCOUNTER — Other Ambulatory Visit: Payer: Self-pay

## 2020-11-18 ENCOUNTER — Ambulatory Visit (HOSPITAL_COMMUNITY)
Admission: RE | Admit: 2020-11-18 | Discharge: 2020-11-18 | Disposition: A | Discharge status: Home / Self Care | Payer: 59 | Source: Ambulatory Visit | Attending: Urology | Admitting: Urology

## 2020-11-18 DIAGNOSIS — C641 Malignant neoplasm of right kidney, except renal pelvis: Secondary | ICD-10-CM

## 2020-12-19 ENCOUNTER — Encounter (HOSPITAL_COMMUNITY): Payer: Self-pay | Admitting: Radiology

## 2021-01-06 ENCOUNTER — Other Ambulatory Visit (HOSPITAL_COMMUNITY): Payer: Self-pay | Admitting: Urology

## 2021-01-06 ENCOUNTER — Other Ambulatory Visit: Payer: Self-pay

## 2021-01-06 ENCOUNTER — Ambulatory Visit (HOSPITAL_COMMUNITY)
Admission: RE | Admit: 2021-01-06 | Discharge: 2021-01-06 | Disposition: A | Discharge status: Home / Self Care | Payer: 59 | Source: Ambulatory Visit | Attending: Urology | Admitting: Urology

## 2021-01-06 DIAGNOSIS — M898X2 Other specified disorders of bone, upper arm: Secondary | ICD-10-CM

## 2021-01-06 DIAGNOSIS — C641 Malignant neoplasm of right kidney, except renal pelvis: Secondary | ICD-10-CM | POA: Diagnosis present

## 2021-06-12 ENCOUNTER — Encounter: Payer: Self-pay | Admitting: *Deleted

## 2021-06-13 ENCOUNTER — Ambulatory Visit: Payer: 59 | Admitting: Internal Medicine

## 2021-06-13 ENCOUNTER — Encounter: Payer: Self-pay | Admitting: Internal Medicine

## 2021-06-13 VITALS — BP 128/82 | HR 70 | Ht 69.0 in | Wt 238.0 lb

## 2021-06-13 DIAGNOSIS — Z86010 Personal history of colonic polyps: Secondary | ICD-10-CM

## 2021-06-13 DIAGNOSIS — R1032 Left lower quadrant pain: Secondary | ICD-10-CM

## 2021-06-13 DIAGNOSIS — Z8719 Personal history of other diseases of the digestive system: Secondary | ICD-10-CM

## 2021-06-13 MED ORDER — PLENVU 140 G PO SOLR: 1.0000 | ORAL | 0 refills | Status: DC

## 2021-06-13 NOTE — Progress Notes (Signed)
? ?Subjective:  ? ? Patient ID: Tina Adams, female    DOB: 1965-05-02, 56 y.o.   MRN: [id number] ? ?HPI ?Tiya Schrupp is a 56 year old female with a past medical history of multiple adenomatous colon polyps, history of diverticulosis and recently treated diverticulitis, history of renal cell carcinoma of the right kidney status post resection, prior cholecystectomy and hysterectomy, history of prior urethral sling who is seen to follow-up diverticulitis and for persistent intermittent left lower quadrant pain.  She is here alone today. ? ?She was treated with ciprofloxacin and metronidazole and Bentyl in mid March after presenting with symptomatic diverticulitis.  She was having severe constant left lower quadrant abdominal pain which did improve with these antibiotics.  However over the last 6 to 8 months she has been having intermittent sharp stabbing left lower quadrant abdominal pain.  This lasts only a few minutes.  Cannot relate this to diet necessarily.  It can be so severe that it can hurt her to stand up.  Does not necessarily start with exertion.  She feels that her bowel movements are mostly normal though occasionally dry.  She gets this sensation in the left lower quadrant like something is stuck together and then releases.  She has tried to eat more fruits and vegetables, less bread and more lean proteins.  These sharp stabbing left lower quadrant pain seems to happen a couple times a week.  No recent fever or chills. ? ?No significant upper GI or hepatobiliary complaint.  She does not take any regular medications.  She is dealing with stressors of aging parents. ? ? ?Review of Systems ?As per HPI, otherwise negative ? ?Current Medications, Allergies, Past Medical History, Past Surgical History, Family History and Social History were reviewed in [employer] record. ?   ?Objective:  ? Physical Exam ?BP 128/82   Pulse 70   Ht '5\' 9"'$  (1.753 m)   Wt 238 lb (108 kg)   SpO2  98%   BMI 35.15 kg/m?  ?Gen: awake, alert, NAD ?HEENT: anicteric  ?CV: RRR, no mrg ?Pulm: CTA b/l ?Abd: soft, obese, mild tenderness in the left lower quadrant without rebound or guarding, no palpable masses, nondistended, +BS throughout ?Ext: no c/c/e ?Neuro: nonfocal ? ?Hemoglobin 15.7, platelet count 262, normal white count in June 2022 ?Liver enzymes normal at that time as well ? ?Patient reports having had [facility] scans by [facility] for [facility] surveillance last around October 2022.  I do not have that report available. ? ? ?   ?Assessment & Plan:  ?56 year old female with a past medical history of multiple adenomatous colon polyps, history of diverticulosis and recently treated diverticulitis, history of renal cell carcinoma of the right kidney status post resection, prior cholecystectomy and hysterectomy, history of prior urethral sling who is seen to follow-up diverticulitis and for persistent intermittent left lower quadrant pain.   ? ?Left lower quadrant abdominal pain/diverticulosis with recent diverticulitis --her diverticulitis was treated and that specific more acute pain resolved.  She is having sporadic intermittent sharp stabbing type left lower quadrant pain.  Perhaps this is more constipation related to colonic spasm.  We discussed this today.  She will soon be due surveillance colonoscopy as well.  Symptoms are somewhat in line with complaints when I saw her in 2020. ?--MiraLAX 17 g daily; 1 month trial to see if constipation may be causing this left lower quadrant abdominal pain. ?--Patient to email me or call in 1 month with update ?--If persistent symptoms I  would recommend we repeat CT scan of the abdomen pelvis with IV contrast ?--Colonoscopy, see #2 ? ?2.  History of multiple adenomatous colon polyps --last colonoscopy 2020, surveillance colonoscopy recommended this year.  We will plan to proceed with surveillance colonoscopy in July.  We reviewed the risk, benefits and alternatives and she  is agreeable and wishes to proceed ?--Surveillance colonoscopy in the [place] later this year for history of multiple adenomatous colon polyps ? ?30 minutes total spent today including patient facing time, coordination of care, reviewing medical history/procedures/pertinent radiology studies, and documentation of the encounter. ? ? ?

## 2021-06-13 NOTE — Patient Instructions (Signed)
You have been scheduled for a colonoscopy. Please follow written instructions given to you at your visit today.  ?Please pick up your prep supplies at the pharmacy within the next 1-3 days. ?If you use inhalers (even only as needed), please bring them with you on the day of your procedure. ? ?Please purchase the following medications over the counter and take as directed: ?Miralax 17 grams (1 capful) dissolved in at least 8 ounces water/juice once daily ? ?Please send [place] a Mychart message in about 1 month to let us know if you are continuing to have sharp left sided abdominal pain. ? ?If you are age 80 or older, your body mass index should be between 23-30. Your Body mass index is 35.15 kg/m?Marland Kitchen If this is out of the aforementioned range listed, please consider follow up with your Primary Care Provider. ? ?If you are age 50 or younger, your body mass index should be between 19-25. Your Body mass index is 35.15 kg/m?Marland Kitchen If this is out of the aformentioned range listed, please consider follow up with your Primary Care Provider.  ? ?________________________________________________________ ? ?The [facility] providers would like to encourage you to use [facility] to communicate with providers for non-urgent requests or questions.  Due to long hold times on the telephone, sending your provider a message by [facility] may be a faster and more efficient way to get a response.  Please allow 48 business hours for a response.  Please remember that this is for non-urgent requests.  ?_______________________________________________________ ? ?Due to recent changes in healthcare laws, you may see the results of your imaging and laboratory studies on MyChart before your provider has had a chance to review them.  We understand that in some cases there may be results that are confusing or concerning to you. Not all laboratory results come back in the same time frame and the provider may be waiting for multiple results in order to interpret  others.  Please give [place] 48 hours in order for your provider to thoroughly review all the results before contacting the office for clarification of your results.  ? ?

## 2021-08-25 ENCOUNTER — Encounter: Payer: Self-pay | Admitting: Internal Medicine

## 2021-08-25 ENCOUNTER — Ambulatory Visit (AMBULATORY_SURGERY_CENTER): Payer: 59 | Admitting: Internal Medicine

## 2021-08-25 VITALS — BP 135/69 | HR 65 | Temp 97.8°F | Resp 16 | Ht 69.0 in | Wt 238.0 lb

## 2021-08-25 DIAGNOSIS — K6389 Other specified diseases of intestine: Secondary | ICD-10-CM | POA: Diagnosis not present

## 2021-08-25 DIAGNOSIS — Z86010 Personal history of colonic polyps: Secondary | ICD-10-CM

## 2021-08-25 DIAGNOSIS — Z09 Encounter for follow-up examination after completed treatment for conditions other than malignant neoplasm: Secondary | ICD-10-CM | POA: Diagnosis not present

## 2021-08-25 DIAGNOSIS — D120 Benign neoplasm of cecum: Secondary | ICD-10-CM

## 2021-08-25 DIAGNOSIS — D124 Benign neoplasm of descending colon: Secondary | ICD-10-CM

## 2021-08-25 DIAGNOSIS — D125 Benign neoplasm of sigmoid colon: Secondary | ICD-10-CM | POA: Diagnosis not present

## 2021-08-25 MED ORDER — SODIUM CHLORIDE 0.9 % IV SOLN: 500.0000 mL | Freq: Once | INTRAVENOUS | Status: DC

## 2021-08-25 NOTE — Progress Notes (Signed)
VSS, transported to PACU °

## 2021-08-25 NOTE — Patient Instructions (Signed)
Await pathology results.  Handout on polyps and diverticulosis given.  YOU HAD AN ENDOSCOPIC PROCEDURE TODAY AT THE [facility]:   Refer to the procedure report that was given to you for any specific questions about what was found during the examination.  If the procedure report does not answer your questions, please call your gastroenterologist to clarify.  If you requested that your care partner not be given the details of your procedure findings, then the procedure report has been included in a sealed envelope for you to review at your convenience later.  YOU SHOULD EXPECT: Some feelings of bloating in the abdomen. Passage of more gas than usual.  Walking can help get rid of the air that was put into your GI tract during the procedure and reduce the bloating. If you had a lower endoscopy (such as a colonoscopy or flexible sigmoidoscopy) you may notice spotting of blood in your stool or on the toilet paper. If you underwent a bowel prep for your procedure, you may not have a normal bowel movement for a few days.  Please Note:  You might notice some irritation and congestion in your nose or some drainage.  This is from the oxygen used during your procedure.  There is no need for concern and it should clear up in a day or so.  SYMPTOMS TO REPORT IMMEDIATELY:  Following lower endoscopy (colonoscopy or flexible sigmoidoscopy):  Excessive amounts of blood in the stool  Significant tenderness or worsening of abdominal pains  Swelling of the abdomen that is new, acute  Fever of 100F or higher  For urgent or emergent issues, a gastroenterologist can be reached at any hour by calling [phone]. Do not use MyChart messaging for urgent concerns.    DIET:  We do recommend a small meal at first, but then you may proceed to your regular diet.  Drink plenty of fluids but you should avoid alcoholic beverages for 24 hours.  ACTIVITY:  You should plan to take it easy for the rest of today  and you should NOT DRIVE or use heavy machinery until tomorrow (because of the sedation medicines used during the test).    FOLLOW UP: Our staff will call the number listed on your records the next business day following your procedure.  We will call around 7:15- 8:00 am to check on you and address any questions or concerns that you may have regarding the information given to you following your procedure. If we do not reach you, we will leave a message.  If you develop any symptoms (ie: fever, flu-like symptoms, shortness of breath, cough etc.) before then, please call [phone].  If you test positive for Covid 19 in the 2 weeks post procedure, please call and report this information to us.    If any biopsies were taken you will be contacted by phone or by letter within the next 1-3 weeks.  Please call us at [phone] if you have not heard about the biopsies in 3 weeks.    SIGNATURES/CONFIDENTIALITY: You and/or your care partner have signed paperwork which will be entered into your electronic medical record.  These signatures attest to the fact that that the information above on your After Visit Summary has been reviewed and is understood.  Full responsibility of the confidentiality of this discharge information lies with you and/or your care-partner.  

## 2021-08-25 NOTE — Progress Notes (Signed)
Called to room to assist during endoscopic procedure.  Patient ID and intended procedure confirmed with present staff. Received instructions for my participation in the procedure from the performing physician.  

## 2021-08-25 NOTE — Progress Notes (Signed)
GASTROENTEROLOGY PROCEDURE H&P NOTE   Primary Care Physician: Tina Elk, MD    Reason for Procedure:  History of multiple adenomatous and sessile serrated colon polyps  Plan:    colonoscopy  [name]  The nature of the procedure, as well as the risks, benefits, and alternatives were carefully and thoroughly reviewed with the patient. Ample time for discussion and questions allowed. The patient understood, was satisfied, and agreed to proceed.     HPI: Tina Adams is a 56 y.o. female who presents for survceillance colonoscopy.  Medical history as below.  Tolerated the prep.  No recent chest pain or shortness of breath.  No abdominal pain today.  Past Medical History:  Diagnosis Date   Allergy    Anemia    Cancer ([place]) 06/2018   renal carcinoma   Diverticulosis    Gallstones    Gastritis    Hepatic steatosis    Hyperplastic colon polyp    Internal hemorrhoids    Renal neoplasm 03/2018   Tubular adenoma of colon    Tubular adenoma of colon    Tubulovillous adenoma of colon     Past Surgical History:  Procedure Laterality Date   ABDOMINAL HYSTERECTOMY     BLADDER REPAIR  2017   "bladder tacking" 2017 or 2018   [place]   plantar fasciitis   PARTIAL HYSTERECTOMY     ROBOTIC ASSITED PARTIAL NEPHRECTOMY Right 07/23/2018   Procedure: XI ROBOTIC ASSITED PARTIAL NEPHRECTOMY;  Surgeon: Ceasar Mons, MD;  Location: [facility];  Service: Urology;  Laterality: Right;   TONSILLECTOMY      Prior to Admission medications   Medication Sig Start Date End Date Taking? Authorizing Provider  albuterol (VENTOLIN HFA) 108 (90 Base) MCG/ACT inhaler Inhale 2 puffs into the lungs every 6 (six) hours as needed. 02/20/20  Yes [provider]  ALPRAZolam Duanne Moron) 0.5 MG tablet Take 0.5 mg by mouth at bedtime as needed for anxiety or sleep.  09/23/13   [provider]  ibuprofen (ADVIL) 200 MG tablet Take 800 mg by mouth daily as needed.    [provider]    Current Outpatient Medications  Medication Sig Dispense Refill   albuterol (VENTOLIN HFA) 108 (90 Base) MCG/ACT inhaler Inhale 2 puffs into the lungs every 6 (six) hours as needed.     ALPRAZolam (XANAX) 0.5 MG tablet Take 0.5 mg by mouth at bedtime as needed for anxiety or sleep.      ibuprofen (ADVIL) 200 MG tablet Take 800 mg by mouth daily as needed.     Current Facility-Administered Medications  Medication Dose Route Frequency Provider Last Rate Last Admin   0.9 %  sodium chloride infusion  500 mL Intravenous Once Felesha Moncrieffe, Lajuan Lines, MD        Allergies as of 08/25/2021 - Review Complete 08/25/2021  Allergen Reaction Noted   Penicillins Anaphylaxis, Hives, and Swelling 12/04/2013   Latex Rash 06/07/2016   Sulfa antibiotics Diarrhea and Nausea Only 04/13/2015    Family History  Problem Relation Age of Onset   COPD Mother    Colon polyps Mother        in her 52s   Diabetes Father    Heart disease Father    Hyperlipidemia Father    Heart disease Maternal Grandmother    Healthy Sister    Colon cancer Neg Hx    Esophageal cancer  Neg Hx    Stomach cancer Neg Hx    Pancreatic cancer Neg Hx     Social History   Socioeconomic History   Marital status: Single    Spouse name: Not on file   Number of children: Not on file   Years of education: Not on file   Highest education level: Not on file  Occupational History   Not on file  Tobacco Use   Smoking status: Former    Packs/day: 0.25    Types: Cigarettes    Quit date: 07/09/2018    Years since quitting: 3.1   Smokeless tobacco: Never  Substance and Sexual Activity   Alcohol use: Yes    Alcohol/week: 0.0 standard drinks of alcohol    Comment: socially   Drug use: Never   Sexual activity: Not on file  Other Topics Concern   Not on file  Social History Narrative   Not on file   Social Determinants of  Health   Financial Resource Strain: Not on file  Food Insecurity: Not on file  Transportation Needs: Not on file  Physical Activity: Not on file  Stress: Not on file  Social Connections: Not on file  Intimate Partner Violence: Not on file    Physical Exam: Vital signs in last 24 hours: '@BP'$  (!) 147/78   Pulse 82   Temp 97.8 F (36.6 C)   Resp 13   Ht '5\' 9"'$  (1.753 m)   Wt 238 lb (108 kg)   SpO2 97%   BMI 35.15 kg/m  GEN: NAD EYE: Sclerae anicteric ENT: MMM CV: Non-tachycardic Pulm: CTA b/l GI: Soft, NT/ND NEURO:  Alert & Oriented x 3   Zenovia Jarred, MD [facility]  08/25/2021 8:06 AM

## 2021-08-25 NOTE — Op Note (Signed)
[facility] Patient Name: Tina Adams Procedure Date: 08/25/2021 8:00 AM MRN: [id number] Endoscopist: Jerene Bears , MD Age: 56 Referring MD:  Date of Birth: [birth year] Gender: Female Account #: [id number] Procedure:                Colonoscopy Indications:              High risk colon cancer surveillance: Personal                            history of multiple adenomas and SSPs, Last                            colonoscopy: October 2020; July 2019 Medicines:                Monitored Anesthesia Care Procedure:                Pre-Anesthesia Assessment:                           - Prior to the procedure, a History and Physical                            was performed, and patient medications and                            allergies were reviewed. The patient's tolerance of                            previous anesthesia was also reviewed. The risks                            and benefits of the procedure and the sedation                            options and risks were discussed with the patient.                            All questions were answered, and informed consent                            was obtained. Prior Anticoagulants: The patient has                            taken no previous anticoagulant or antiplatelet                            agents. ASA Grade Assessment: II - A patient with                            mild systemic disease. After reviewing the risks                            and benefits, the patient was deemed in  satisfactory condition to undergo the procedure.                           After obtaining informed consent, the colonoscope                            was passed under direct vision. Throughout the                            procedure, the patient's blood pressure, pulse, and                            oxygen saturations were monitored continuously. The                            Olympus PCF-H190DL (#[id number])  Colonoscope was                            introduced through the anus and advanced to the                            cecum, identified by appendiceal orifice and                            ileocecal valve. The colonoscopy was performed                            without difficulty. The patient tolerated the                            procedure well. The quality of the bowel                            preparation was good. The ileocecal valve,                            appendiceal orifice, and rectum were photographed. Scope In: 8:12:29 AM Scope Out: 8:29:06 AM Scope Withdrawal Time: 0 hours 13 minutes 28 seconds  Total Procedure Duration: 0 hours 16 minutes 37 seconds  Findings:                 The digital rectal exam was normal.                           A 2 mm polyp was found in the cecum. The polyp was                            sessile. The polyp was removed with a cold biopsy                            forceps. Resection and retrieval were complete.                           Four sessile polyps were found in the sigmoid colon                            (  2) and descending colon (2). The polyps were 3 to                            5 mm in size. These polyps were removed with a cold                            snare. Resection and retrieval were complete.                           Multiple small and large-mouthed diverticula were                            found in the proximal sigmoid colon, descending                            colon and distal transverse colon.                           The retroflexed view of the distal rectum and anal                            verge was normal and showed no anal or rectal                            abnormalities. Complications:            No immediate complications. Estimated Blood Loss:     Estimated blood loss was minimal. Impression:               - One 2 mm polyp in the cecum, removed with a cold                            biopsy forceps.  Resected and retrieved.                           - Four 3 to 5 mm polyps in the sigmoid colon (2)                            and in the descending colon (2), removed with a                            cold snare. Resected and retrieved.                           - Moderate diverticulosis in the proximal sigmoid                            colon, in the descending colon and in the distal                            transverse colon.                           - The distal rectum and  anal verge are normal on                            retroflexion view. Recommendation:           - Patient has a contact number available for                            emergencies. The signs and symptoms of potential                            delayed complications were discussed with the                            patient. Return to normal activities tomorrow.                            Written discharge instructions were provided to the                            patient.                           - Resume previous diet.                           - Continue present medications.                           - Await pathology results.                           - Repeat colonoscopy is recommended for                            surveillance. The colonoscopy date will be                            determined after pathology results from today's                            exam become available for review. Jerene Bears, MD 08/25/2021 8:34:51 AM This report has been signed electronically.

## 2021-08-28 ENCOUNTER — Telehealth: Payer: Self-pay

## 2021-08-28 NOTE — Telephone Encounter (Signed)
  Follow up Call-     08/25/2021    7:17 AM  Call back number  Post procedure Call Back phone  # [phone]  Permission to leave phone message Yes     Patient questions:  Do you have a fever, pain , or abdominal swelling? No. Pain Score  0 *  Have you tolerated food without any problems? Yes.    Have you been able to return to your normal activities? Yes.    Do you have any questions about your discharge instructions: Diet   No. Medications  No. Follow up visit  No.  Do you have questions or concerns about your Care? No.  Actions: * If pain score is 4 or above: No action needed, pain <4.

## 2021-08-29 ENCOUNTER — Encounter: Payer: Self-pay | Admitting: Internal Medicine

## 2021-10-13 ENCOUNTER — Other Ambulatory Visit (HOSPITAL_COMMUNITY): Payer: Self-pay | Admitting: Urology

## 2021-10-13 ENCOUNTER — Ambulatory Visit (HOSPITAL_COMMUNITY)
Admission: RE | Admit: 2021-10-13 | Discharge: 2021-10-13 | Disposition: A | Discharge status: Home / Self Care | Payer: 59 | Source: Ambulatory Visit | Attending: Urology | Admitting: Urology

## 2021-10-13 DIAGNOSIS — C641 Malignant neoplasm of right kidney, except renal pelvis: Secondary | ICD-10-CM | POA: Diagnosis present

## 2023-03-13 ENCOUNTER — Emergency Department (HOSPITAL_BASED_OUTPATIENT_CLINIC_OR_DEPARTMENT_OTHER)
Admission: EM | Admit: 2023-03-13 | Discharge: 2023-03-13 | Disposition: A | Discharge status: Home / Self Care | Payer: No Typology Code available for payment source | Attending: Emergency Medicine | Admitting: Emergency Medicine

## 2023-03-13 ENCOUNTER — Emergency Department (HOSPITAL_BASED_OUTPATIENT_CLINIC_OR_DEPARTMENT_OTHER): Payer: No Typology Code available for payment source

## 2023-03-13 ENCOUNTER — Other Ambulatory Visit: Payer: Self-pay

## 2023-03-13 ENCOUNTER — Encounter (HOSPITAL_BASED_OUTPATIENT_CLINIC_OR_DEPARTMENT_OTHER): Payer: Self-pay | Admitting: Emergency Medicine

## 2023-03-13 DIAGNOSIS — Y92410 Unspecified street and highway as the place of occurrence of the external cause: Secondary | ICD-10-CM | POA: Insufficient documentation

## 2023-03-13 DIAGNOSIS — M898X1 Other specified disorders of bone, shoulder: Secondary | ICD-10-CM

## 2023-03-13 DIAGNOSIS — R519 Headache, unspecified: Secondary | ICD-10-CM | POA: Diagnosis not present

## 2023-03-13 DIAGNOSIS — Z91040 Latex allergy status: Secondary | ICD-10-CM | POA: Diagnosis not present

## 2023-03-13 DIAGNOSIS — M25512 Pain in left shoulder: Secondary | ICD-10-CM | POA: Insufficient documentation

## 2023-03-13 DIAGNOSIS — M545 Low back pain, unspecified: Secondary | ICD-10-CM | POA: Diagnosis not present

## 2023-03-13 DIAGNOSIS — M79642 Pain in left hand: Secondary | ICD-10-CM | POA: Diagnosis present

## 2023-03-13 MED ORDER — OXYCODONE-ACETAMINOPHEN 5-325 MG PO TABS
2.0000 | ORAL_TABLET | Freq: Once | ORAL | Status: AC
  Administered 2023-03-13: 2 via ORAL
  Filled 2023-03-13: qty 2

## 2023-03-13 MED ORDER — CYCLOBENZAPRINE HCL 10 MG PO TABS: 10.0000 mg | ORAL_TABLET | Freq: Two times a day (BID) | ORAL | 0 refills | Status: AC | PRN
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
## 2023-03-13 NOTE — ED Notes (Signed)
